# Patient Record
Sex: Female | Born: 1945 | Race: White | Hispanic: No | Marital: Married | Smoking: Never smoker
Health system: Southern US, Community
[De-identification: ages and names within clinical notes are randomized; demographics above are authoritative.]

## PROBLEM LIST (undated history)

## (undated) DIAGNOSIS — Z9889 Other specified postprocedural states: Secondary | ICD-10-CM

## (undated) DIAGNOSIS — F419 Anxiety disorder, unspecified: Secondary | ICD-10-CM

## (undated) DIAGNOSIS — F32A Depression, unspecified: Secondary | ICD-10-CM

## (undated) DIAGNOSIS — K759 Inflammatory liver disease, unspecified: Secondary | ICD-10-CM

## (undated) DIAGNOSIS — K219 Gastro-esophageal reflux disease without esophagitis: Secondary | ICD-10-CM

## (undated) DIAGNOSIS — Z800 Family history of malignant neoplasm of digestive organs: Secondary | ICD-10-CM

## (undated) DIAGNOSIS — Z860100 Personal history of colon polyps, unspecified: Secondary | ICD-10-CM

## (undated) DIAGNOSIS — E7800 Pure hypercholesterolemia, unspecified: Secondary | ICD-10-CM

## (undated) DIAGNOSIS — Z8619 Personal history of other infectious and parasitic diseases: Secondary | ICD-10-CM

## (undated) HISTORY — DX: Personal history of colonic polyps: Z86.010

## (undated) HISTORY — DX: Pure hypercholesterolemia, unspecified: E78.00

## (undated) HISTORY — PX: TUBAL LIGATION: SHX77

## (undated) HISTORY — DX: Gastro-esophageal reflux disease without esophagitis: K21.9

## (undated) HISTORY — PX: KNEE ARTHROSCOPY: SUR90

## (undated) HISTORY — DX: Personal history of other infectious and parasitic diseases: Z86.19

## (undated) HISTORY — DX: Personal history of colon polyps, unspecified: Z86.0100

## (undated) HISTORY — DX: Depression, unspecified: F32.A

## (undated) HISTORY — DX: Family history of malignant neoplasm of digestive organs: Z80.0

## (undated) HISTORY — PX: LARYNGOPLASTY: SHX282

---

## 1998-04-21 ENCOUNTER — Ambulatory Visit (HOSPITAL_COMMUNITY): Admission: RE | Admit: 1998-04-21 | Discharge: 1998-04-22 | Payer: Self-pay | Admitting: General Surgery

## 2005-05-24 ENCOUNTER — Ambulatory Visit (HOSPITAL_COMMUNITY): Admission: RE | Admit: 2005-05-24 | Discharge: 2005-05-24 | Payer: Self-pay | Admitting: Orthopaedic Surgery

## 2005-05-24 ENCOUNTER — Ambulatory Visit (HOSPITAL_BASED_OUTPATIENT_CLINIC_OR_DEPARTMENT_OTHER): Admission: RE | Admit: 2005-05-24 | Discharge: 2005-05-24 | Payer: Self-pay | Admitting: Orthopaedic Surgery

## 2016-12-07 DIAGNOSIS — G4700 Insomnia, unspecified: Secondary | ICD-10-CM | POA: Diagnosis not present

## 2016-12-07 DIAGNOSIS — K219 Gastro-esophageal reflux disease without esophagitis: Secondary | ICD-10-CM | POA: Diagnosis not present

## 2016-12-07 DIAGNOSIS — Z79899 Other long term (current) drug therapy: Secondary | ICD-10-CM | POA: Diagnosis not present

## 2016-12-07 DIAGNOSIS — E785 Hyperlipidemia, unspecified: Secondary | ICD-10-CM | POA: Diagnosis not present

## 2016-12-09 DIAGNOSIS — Z79899 Other long term (current) drug therapy: Secondary | ICD-10-CM | POA: Diagnosis not present

## 2016-12-09 DIAGNOSIS — E785 Hyperlipidemia, unspecified: Secondary | ICD-10-CM | POA: Diagnosis not present

## 2017-05-30 DIAGNOSIS — Z79899 Other long term (current) drug therapy: Secondary | ICD-10-CM | POA: Diagnosis not present

## 2017-05-30 DIAGNOSIS — G4700 Insomnia, unspecified: Secondary | ICD-10-CM | POA: Diagnosis not present

## 2017-05-30 DIAGNOSIS — K219 Gastro-esophageal reflux disease without esophagitis: Secondary | ICD-10-CM | POA: Diagnosis not present

## 2017-05-30 DIAGNOSIS — E785 Hyperlipidemia, unspecified: Secondary | ICD-10-CM | POA: Diagnosis not present

## 2017-07-27 DIAGNOSIS — D485 Neoplasm of uncertain behavior of skin: Secondary | ICD-10-CM | POA: Diagnosis not present

## 2017-07-27 DIAGNOSIS — C44519 Basal cell carcinoma of skin of other part of trunk: Secondary | ICD-10-CM | POA: Diagnosis not present

## 2017-07-27 DIAGNOSIS — D171 Benign lipomatous neoplasm of skin and subcutaneous tissue of trunk: Secondary | ICD-10-CM | POA: Diagnosis not present

## 2017-07-27 DIAGNOSIS — L821 Other seborrheic keratosis: Secondary | ICD-10-CM | POA: Diagnosis not present

## 2017-07-27 DIAGNOSIS — L720 Epidermal cyst: Secondary | ICD-10-CM | POA: Diagnosis not present

## 2017-08-17 DIAGNOSIS — M7742 Metatarsalgia, left foot: Secondary | ICD-10-CM | POA: Diagnosis not present

## 2017-09-12 DIAGNOSIS — C44719 Basal cell carcinoma of skin of left lower limb, including hip: Secondary | ICD-10-CM | POA: Diagnosis not present

## 2017-09-19 DIAGNOSIS — C44519 Basal cell carcinoma of skin of other part of trunk: Secondary | ICD-10-CM | POA: Diagnosis not present

## 2017-11-30 DIAGNOSIS — E785 Hyperlipidemia, unspecified: Secondary | ICD-10-CM | POA: Diagnosis not present

## 2017-11-30 DIAGNOSIS — Z79899 Other long term (current) drug therapy: Secondary | ICD-10-CM | POA: Diagnosis not present

## 2017-11-30 DIAGNOSIS — G4700 Insomnia, unspecified: Secondary | ICD-10-CM | POA: Diagnosis not present

## 2017-11-30 DIAGNOSIS — Z131 Encounter for screening for diabetes mellitus: Secondary | ICD-10-CM | POA: Diagnosis not present

## 2017-12-20 DIAGNOSIS — R0982 Postnasal drip: Secondary | ICD-10-CM | POA: Diagnosis not present

## 2017-12-20 DIAGNOSIS — J029 Acute pharyngitis, unspecified: Secondary | ICD-10-CM | POA: Diagnosis not present

## 2018-01-10 DIAGNOSIS — G4700 Insomnia, unspecified: Secondary | ICD-10-CM | POA: Diagnosis not present

## 2018-04-02 DIAGNOSIS — L255 Unspecified contact dermatitis due to plants, except food: Secondary | ICD-10-CM | POA: Diagnosis not present

## 2018-06-18 DIAGNOSIS — Z6820 Body mass index (BMI) 20.0-20.9, adult: Secondary | ICD-10-CM | POA: Diagnosis not present

## 2018-06-18 DIAGNOSIS — K219 Gastro-esophageal reflux disease without esophagitis: Secondary | ICD-10-CM | POA: Diagnosis not present

## 2018-06-18 DIAGNOSIS — G4700 Insomnia, unspecified: Secondary | ICD-10-CM | POA: Diagnosis not present

## 2018-06-18 DIAGNOSIS — R7303 Prediabetes: Secondary | ICD-10-CM | POA: Diagnosis not present

## 2018-06-18 DIAGNOSIS — Z79899 Other long term (current) drug therapy: Secondary | ICD-10-CM | POA: Diagnosis not present

## 2018-06-18 DIAGNOSIS — E785 Hyperlipidemia, unspecified: Secondary | ICD-10-CM | POA: Diagnosis not present

## 2018-06-26 DIAGNOSIS — H524 Presbyopia: Secondary | ICD-10-CM | POA: Diagnosis not present

## 2018-06-26 DIAGNOSIS — H2513 Age-related nuclear cataract, bilateral: Secondary | ICD-10-CM | POA: Diagnosis not present

## 2018-09-26 DIAGNOSIS — Z0001 Encounter for general adult medical examination with abnormal findings: Secondary | ICD-10-CM | POA: Diagnosis not present

## 2018-09-26 DIAGNOSIS — Z1339 Encounter for screening examination for other mental health and behavioral disorders: Secondary | ICD-10-CM | POA: Diagnosis not present

## 2018-09-26 DIAGNOSIS — K649 Unspecified hemorrhoids: Secondary | ICD-10-CM | POA: Diagnosis not present

## 2018-09-26 DIAGNOSIS — Z6821 Body mass index (BMI) 21.0-21.9, adult: Secondary | ICD-10-CM | POA: Diagnosis not present

## 2018-09-26 DIAGNOSIS — Z1331 Encounter for screening for depression: Secondary | ICD-10-CM | POA: Diagnosis not present

## 2018-09-26 DIAGNOSIS — Z23 Encounter for immunization: Secondary | ICD-10-CM | POA: Diagnosis not present

## 2018-10-02 DIAGNOSIS — C44619 Basal cell carcinoma of skin of left upper limb, including shoulder: Secondary | ICD-10-CM | POA: Diagnosis not present

## 2018-10-02 DIAGNOSIS — C44719 Basal cell carcinoma of skin of left lower limb, including hip: Secondary | ICD-10-CM | POA: Diagnosis not present

## 2018-11-13 DIAGNOSIS — R1013 Epigastric pain: Secondary | ICD-10-CM | POA: Diagnosis not present

## 2018-11-14 ENCOUNTER — Other Ambulatory Visit: Payer: Self-pay | Admitting: Family

## 2018-11-14 ENCOUNTER — Ambulatory Visit
Admission: RE | Admit: 2018-11-14 | Discharge: 2018-11-14 | Disposition: A | Discharge status: Home / Self Care | Payer: Self-pay | Source: Ambulatory Visit | Attending: Family | Admitting: Family

## 2018-11-14 DIAGNOSIS — Z6822 Body mass index (BMI) 22.0-22.9, adult: Secondary | ICD-10-CM | POA: Diagnosis not present

## 2018-11-14 DIAGNOSIS — R29818 Other symptoms and signs involving the nervous system: Secondary | ICD-10-CM

## 2018-11-14 DIAGNOSIS — R079 Chest pain, unspecified: Secondary | ICD-10-CM | POA: Diagnosis not present

## 2018-11-14 DIAGNOSIS — R55 Syncope and collapse: Secondary | ICD-10-CM | POA: Diagnosis not present

## 2018-11-16 DIAGNOSIS — R1013 Epigastric pain: Secondary | ICD-10-CM | POA: Diagnosis not present

## 2018-11-16 DIAGNOSIS — M4186 Other forms of scoliosis, lumbar region: Secondary | ICD-10-CM | POA: Diagnosis not present

## 2018-11-16 DIAGNOSIS — I700 Atherosclerosis of aorta: Secondary | ICD-10-CM | POA: Diagnosis not present

## 2018-11-16 DIAGNOSIS — K5730 Diverticulosis of large intestine without perforation or abscess without bleeding: Secondary | ICD-10-CM | POA: Diagnosis not present

## 2018-11-23 DIAGNOSIS — R1013 Epigastric pain: Secondary | ICD-10-CM | POA: Diagnosis not present

## 2018-11-27 DIAGNOSIS — R079 Chest pain, unspecified: Secondary | ICD-10-CM | POA: Diagnosis not present

## 2018-11-27 DIAGNOSIS — R29818 Other symptoms and signs involving the nervous system: Secondary | ICD-10-CM | POA: Diagnosis not present

## 2018-11-27 DIAGNOSIS — I6523 Occlusion and stenosis of bilateral carotid arteries: Secondary | ICD-10-CM | POA: Diagnosis not present

## 2018-12-25 DIAGNOSIS — C44629 Squamous cell carcinoma of skin of left upper limb, including shoulder: Secondary | ICD-10-CM | POA: Diagnosis not present

## 2019-03-25 DIAGNOSIS — M25561 Pain in right knee: Secondary | ICD-10-CM | POA: Diagnosis not present

## 2019-04-12 DIAGNOSIS — M25561 Pain in right knee: Secondary | ICD-10-CM | POA: Diagnosis not present

## 2019-04-16 DIAGNOSIS — G4700 Insomnia, unspecified: Secondary | ICD-10-CM | POA: Diagnosis not present

## 2019-04-16 DIAGNOSIS — Z6822 Body mass index (BMI) 22.0-22.9, adult: Secondary | ICD-10-CM | POA: Diagnosis not present

## 2019-04-16 DIAGNOSIS — Z79899 Other long term (current) drug therapy: Secondary | ICD-10-CM | POA: Diagnosis not present

## 2019-04-16 DIAGNOSIS — E785 Hyperlipidemia, unspecified: Secondary | ICD-10-CM | POA: Diagnosis not present

## 2019-04-24 DIAGNOSIS — M1711 Unilateral primary osteoarthritis, right knee: Secondary | ICD-10-CM | POA: Diagnosis not present

## 2019-04-25 DIAGNOSIS — L910 Hypertrophic scar: Secondary | ICD-10-CM | POA: Diagnosis not present

## 2019-05-16 DIAGNOSIS — Y999 Unspecified external cause status: Secondary | ICD-10-CM | POA: Diagnosis not present

## 2019-05-16 DIAGNOSIS — M1711 Unilateral primary osteoarthritis, right knee: Secondary | ICD-10-CM | POA: Diagnosis not present

## 2019-05-16 DIAGNOSIS — X58XXXA Exposure to other specified factors, initial encounter: Secondary | ICD-10-CM | POA: Diagnosis not present

## 2019-05-16 DIAGNOSIS — S83231A Complex tear of medial meniscus, current injury, right knee, initial encounter: Secondary | ICD-10-CM | POA: Diagnosis not present

## 2019-05-16 DIAGNOSIS — S83271A Complex tear of lateral meniscus, current injury, right knee, initial encounter: Secondary | ICD-10-CM | POA: Diagnosis not present

## 2019-05-27 DIAGNOSIS — M1711 Unilateral primary osteoarthritis, right knee: Secondary | ICD-10-CM | POA: Diagnosis not present

## 2019-06-04 DIAGNOSIS — M25561 Pain in right knee: Secondary | ICD-10-CM | POA: Diagnosis not present

## 2019-06-12 DIAGNOSIS — M25561 Pain in right knee: Secondary | ICD-10-CM | POA: Diagnosis not present

## 2019-06-12 DIAGNOSIS — I10 Essential (primary) hypertension: Secondary | ICD-10-CM | POA: Diagnosis not present

## 2019-06-17 DIAGNOSIS — M25511 Pain in right shoulder: Secondary | ICD-10-CM | POA: Diagnosis not present

## 2019-06-24 DIAGNOSIS — M25511 Pain in right shoulder: Secondary | ICD-10-CM | POA: Diagnosis not present

## 2019-06-26 DIAGNOSIS — M25511 Pain in right shoulder: Secondary | ICD-10-CM | POA: Diagnosis not present

## 2019-06-27 DIAGNOSIS — M25511 Pain in right shoulder: Secondary | ICD-10-CM | POA: Diagnosis not present

## 2019-06-28 ENCOUNTER — Other Ambulatory Visit: Payer: Self-pay

## 2019-07-01 DIAGNOSIS — M25511 Pain in right shoulder: Secondary | ICD-10-CM | POA: Diagnosis not present

## 2019-07-03 DIAGNOSIS — M25511 Pain in right shoulder: Secondary | ICD-10-CM | POA: Diagnosis not present

## 2019-07-08 DIAGNOSIS — M25511 Pain in right shoulder: Secondary | ICD-10-CM | POA: Diagnosis not present

## 2019-07-10 DIAGNOSIS — M25511 Pain in right shoulder: Secondary | ICD-10-CM | POA: Diagnosis not present

## 2019-07-16 DIAGNOSIS — M25511 Pain in right shoulder: Secondary | ICD-10-CM | POA: Diagnosis not present

## 2019-07-23 DIAGNOSIS — Z1331 Encounter for screening for depression: Secondary | ICD-10-CM | POA: Diagnosis not present

## 2019-07-23 DIAGNOSIS — Z6821 Body mass index (BMI) 21.0-21.9, adult: Secondary | ICD-10-CM | POA: Diagnosis not present

## 2019-07-23 DIAGNOSIS — M25511 Pain in right shoulder: Secondary | ICD-10-CM | POA: Diagnosis not present

## 2019-07-23 DIAGNOSIS — G4700 Insomnia, unspecified: Secondary | ICD-10-CM | POA: Diagnosis not present

## 2019-07-29 DIAGNOSIS — M25511 Pain in right shoulder: Secondary | ICD-10-CM | POA: Diagnosis not present

## 2019-09-25 DIAGNOSIS — M25511 Pain in right shoulder: Secondary | ICD-10-CM | POA: Diagnosis not present

## 2019-09-30 DIAGNOSIS — M25511 Pain in right shoulder: Secondary | ICD-10-CM | POA: Diagnosis not present

## 2019-10-03 DIAGNOSIS — M25511 Pain in right shoulder: Secondary | ICD-10-CM | POA: Diagnosis not present

## 2019-10-07 DIAGNOSIS — M25511 Pain in right shoulder: Secondary | ICD-10-CM | POA: Diagnosis not present

## 2019-10-10 DIAGNOSIS — M25511 Pain in right shoulder: Secondary | ICD-10-CM | POA: Diagnosis not present

## 2019-10-15 DIAGNOSIS — Z6821 Body mass index (BMI) 21.0-21.9, adult: Secondary | ICD-10-CM | POA: Diagnosis not present

## 2019-10-15 DIAGNOSIS — F5101 Primary insomnia: Secondary | ICD-10-CM | POA: Diagnosis not present

## 2019-10-15 DIAGNOSIS — Z1211 Encounter for screening for malignant neoplasm of colon: Secondary | ICD-10-CM | POA: Diagnosis not present

## 2019-10-15 DIAGNOSIS — E785 Hyperlipidemia, unspecified: Secondary | ICD-10-CM | POA: Diagnosis not present

## 2019-10-15 DIAGNOSIS — Z79899 Other long term (current) drug therapy: Secondary | ICD-10-CM | POA: Diagnosis not present

## 2019-10-18 DIAGNOSIS — M25511 Pain in right shoulder: Secondary | ICD-10-CM | POA: Diagnosis not present

## 2019-10-23 DIAGNOSIS — M25511 Pain in right shoulder: Secondary | ICD-10-CM | POA: Diagnosis not present

## 2019-10-23 DIAGNOSIS — M6281 Muscle weakness (generalized): Secondary | ICD-10-CM | POA: Diagnosis not present

## 2019-10-23 DIAGNOSIS — M25611 Stiffness of right shoulder, not elsewhere classified: Secondary | ICD-10-CM | POA: Diagnosis not present

## 2019-10-28 DIAGNOSIS — M25611 Stiffness of right shoulder, not elsewhere classified: Secondary | ICD-10-CM | POA: Diagnosis not present

## 2019-10-28 DIAGNOSIS — M6281 Muscle weakness (generalized): Secondary | ICD-10-CM | POA: Diagnosis not present

## 2019-10-29 DIAGNOSIS — Z1211 Encounter for screening for malignant neoplasm of colon: Secondary | ICD-10-CM | POA: Diagnosis not present

## 2019-10-29 DIAGNOSIS — Z1212 Encounter for screening for malignant neoplasm of rectum: Secondary | ICD-10-CM | POA: Diagnosis not present

## 2019-11-04 DIAGNOSIS — M6281 Muscle weakness (generalized): Secondary | ICD-10-CM | POA: Diagnosis not present

## 2019-11-04 DIAGNOSIS — M25611 Stiffness of right shoulder, not elsewhere classified: Secondary | ICD-10-CM | POA: Diagnosis not present

## 2019-11-05 DIAGNOSIS — C44519 Basal cell carcinoma of skin of other part of trunk: Secondary | ICD-10-CM | POA: Diagnosis not present

## 2019-11-11 DIAGNOSIS — M6281 Muscle weakness (generalized): Secondary | ICD-10-CM | POA: Diagnosis not present

## 2019-11-11 DIAGNOSIS — M25611 Stiffness of right shoulder, not elsewhere classified: Secondary | ICD-10-CM | POA: Diagnosis not present

## 2019-11-19 DIAGNOSIS — M25611 Stiffness of right shoulder, not elsewhere classified: Secondary | ICD-10-CM | POA: Diagnosis not present

## 2019-11-19 DIAGNOSIS — M6281 Muscle weakness (generalized): Secondary | ICD-10-CM | POA: Diagnosis not present

## 2019-11-27 DIAGNOSIS — C44529 Squamous cell carcinoma of skin of other part of trunk: Secondary | ICD-10-CM | POA: Diagnosis not present

## 2019-12-04 DIAGNOSIS — M25611 Stiffness of right shoulder, not elsewhere classified: Secondary | ICD-10-CM | POA: Diagnosis not present

## 2019-12-04 DIAGNOSIS — R2231 Localized swelling, mass and lump, right upper limb: Secondary | ICD-10-CM | POA: Diagnosis not present

## 2019-12-04 DIAGNOSIS — M6281 Muscle weakness (generalized): Secondary | ICD-10-CM | POA: Diagnosis not present

## 2019-12-04 DIAGNOSIS — M7521 Bicipital tendinitis, right shoulder: Secondary | ICD-10-CM | POA: Diagnosis not present

## 2019-12-12 DIAGNOSIS — M25511 Pain in right shoulder: Secondary | ICD-10-CM | POA: Diagnosis not present

## 2019-12-18 DIAGNOSIS — E785 Hyperlipidemia, unspecified: Secondary | ICD-10-CM | POA: Diagnosis not present

## 2019-12-18 DIAGNOSIS — K219 Gastro-esophageal reflux disease without esophagitis: Secondary | ICD-10-CM | POA: Diagnosis not present

## 2019-12-18 DIAGNOSIS — Z6821 Body mass index (BMI) 21.0-21.9, adult: Secondary | ICD-10-CM | POA: Diagnosis not present

## 2019-12-18 DIAGNOSIS — F5101 Primary insomnia: Secondary | ICD-10-CM | POA: Diagnosis not present

## 2019-12-18 DIAGNOSIS — L231 Allergic contact dermatitis due to adhesives: Secondary | ICD-10-CM | POA: Diagnosis not present

## 2019-12-25 DIAGNOSIS — D2111 Benign neoplasm of connective and other soft tissue of right upper limb, including shoulder: Secondary | ICD-10-CM | POA: Diagnosis not present

## 2020-01-30 DIAGNOSIS — D1721 Benign lipomatous neoplasm of skin and subcutaneous tissue of right arm: Secondary | ICD-10-CM | POA: Diagnosis not present

## 2020-02-10 DIAGNOSIS — Z9889 Other specified postprocedural states: Secondary | ICD-10-CM | POA: Diagnosis not present

## 2020-02-13 DIAGNOSIS — K219 Gastro-esophageal reflux disease without esophagitis: Secondary | ICD-10-CM | POA: Diagnosis not present

## 2020-02-13 DIAGNOSIS — K5900 Constipation, unspecified: Secondary | ICD-10-CM | POA: Diagnosis not present

## 2020-02-13 DIAGNOSIS — R1013 Epigastric pain: Secondary | ICD-10-CM | POA: Diagnosis not present

## 2020-03-04 DIAGNOSIS — M1711 Unilateral primary osteoarthritis, right knee: Secondary | ICD-10-CM | POA: Diagnosis not present

## 2020-03-18 DIAGNOSIS — R1013 Epigastric pain: Secondary | ICD-10-CM | POA: Diagnosis not present

## 2020-03-18 DIAGNOSIS — K5900 Constipation, unspecified: Secondary | ICD-10-CM | POA: Diagnosis not present

## 2020-03-18 DIAGNOSIS — K219 Gastro-esophageal reflux disease without esophagitis: Secondary | ICD-10-CM | POA: Diagnosis not present

## 2020-03-23 DIAGNOSIS — K219 Gastro-esophageal reflux disease without esophagitis: Secondary | ICD-10-CM | POA: Diagnosis not present

## 2020-03-23 DIAGNOSIS — E785 Hyperlipidemia, unspecified: Secondary | ICD-10-CM | POA: Diagnosis not present

## 2020-03-23 DIAGNOSIS — R1013 Epigastric pain: Secondary | ICD-10-CM | POA: Diagnosis not present

## 2020-03-23 DIAGNOSIS — Z1331 Encounter for screening for depression: Secondary | ICD-10-CM | POA: Diagnosis not present

## 2020-03-23 DIAGNOSIS — Z79899 Other long term (current) drug therapy: Secondary | ICD-10-CM | POA: Diagnosis not present

## 2020-03-23 DIAGNOSIS — F5101 Primary insomnia: Secondary | ICD-10-CM | POA: Diagnosis not present

## 2020-04-01 DIAGNOSIS — Z1159 Encounter for screening for other viral diseases: Secondary | ICD-10-CM | POA: Diagnosis not present

## 2020-04-01 DIAGNOSIS — Z20828 Contact with and (suspected) exposure to other viral communicable diseases: Secondary | ICD-10-CM | POA: Diagnosis not present

## 2020-04-07 DIAGNOSIS — K222 Esophageal obstruction: Secondary | ICD-10-CM | POA: Diagnosis not present

## 2020-04-07 DIAGNOSIS — R110 Nausea: Secondary | ICD-10-CM | POA: Diagnosis not present

## 2020-04-07 DIAGNOSIS — K219 Gastro-esophageal reflux disease without esophagitis: Secondary | ICD-10-CM | POA: Diagnosis not present

## 2020-04-07 DIAGNOSIS — Z8619 Personal history of other infectious and parasitic diseases: Secondary | ICD-10-CM | POA: Diagnosis not present

## 2020-04-07 DIAGNOSIS — R140 Abdominal distension (gaseous): Secondary | ICD-10-CM | POA: Diagnosis not present

## 2020-04-07 DIAGNOSIS — Z79899 Other long term (current) drug therapy: Secondary | ICD-10-CM | POA: Diagnosis not present

## 2020-04-07 DIAGNOSIS — K449 Diaphragmatic hernia without obstruction or gangrene: Secondary | ICD-10-CM | POA: Diagnosis not present

## 2020-04-07 DIAGNOSIS — R1013 Epigastric pain: Secondary | ICD-10-CM | POA: Diagnosis not present

## 2020-06-05 DIAGNOSIS — S61211A Laceration without foreign body of left index finger without damage to nail, initial encounter: Secondary | ICD-10-CM | POA: Diagnosis not present

## 2020-06-05 DIAGNOSIS — Z23 Encounter for immunization: Secondary | ICD-10-CM | POA: Diagnosis not present

## 2020-06-15 DIAGNOSIS — Z4802 Encounter for removal of sutures: Secondary | ICD-10-CM | POA: Diagnosis not present

## 2020-06-15 DIAGNOSIS — S61211D Laceration without foreign body of left index finger without damage to nail, subsequent encounter: Secondary | ICD-10-CM | POA: Diagnosis not present

## 2020-07-06 DIAGNOSIS — M79645 Pain in left finger(s): Secondary | ICD-10-CM | POA: Diagnosis not present

## 2020-07-06 DIAGNOSIS — S66321D Laceration of extensor muscle, fascia and tendon of left index finger at wrist and hand level, subsequent encounter: Secondary | ICD-10-CM | POA: Diagnosis not present

## 2020-07-06 DIAGNOSIS — M6281 Muscle weakness (generalized): Secondary | ICD-10-CM | POA: Diagnosis not present

## 2020-07-06 DIAGNOSIS — M25642 Stiffness of left hand, not elsewhere classified: Secondary | ICD-10-CM | POA: Diagnosis not present

## 2020-07-08 DIAGNOSIS — S66321D Laceration of extensor muscle, fascia and tendon of left index finger at wrist and hand level, subsequent encounter: Secondary | ICD-10-CM | POA: Diagnosis not present

## 2020-07-08 DIAGNOSIS — M25642 Stiffness of left hand, not elsewhere classified: Secondary | ICD-10-CM | POA: Diagnosis not present

## 2020-07-08 DIAGNOSIS — M6281 Muscle weakness (generalized): Secondary | ICD-10-CM | POA: Diagnosis not present

## 2020-07-08 DIAGNOSIS — M79645 Pain in left finger(s): Secondary | ICD-10-CM | POA: Diagnosis not present

## 2020-07-10 IMAGING — CT CT HEAD W/O CM
1 series · 16 of 30 positions shown, 20 images · non-contrast
Comparison: None

CLINICAL DATA: Syncopal episode [REDACTED] night, unable to feel LEFT
hand, severe headache

EXAM:
CT HEAD WITHOUT CONTRAST
TECHNIQUE: Contiguous axial images were obtained from the base of the skull
through the vertex without intravenous contrast. Sagittal and
coronal MPR images reconstructed from axial data set.

[Series 2: head w/(date) · axial · 0.43mm/px · z∈[-193,-28]mm · 16 of 37 slices shown, 20 images]
[im 2/37  brain]
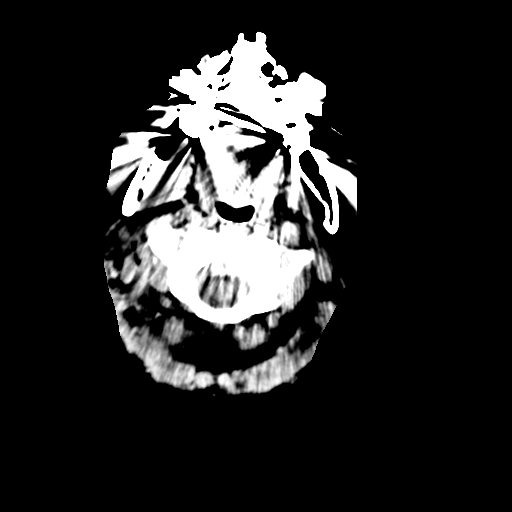
[im 2/37  bone]
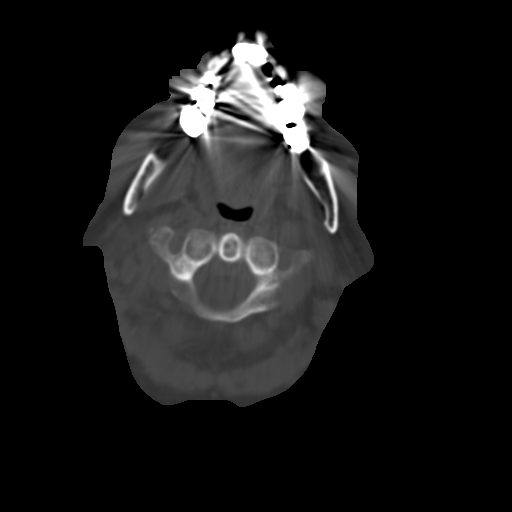
[im 4/37  brain]
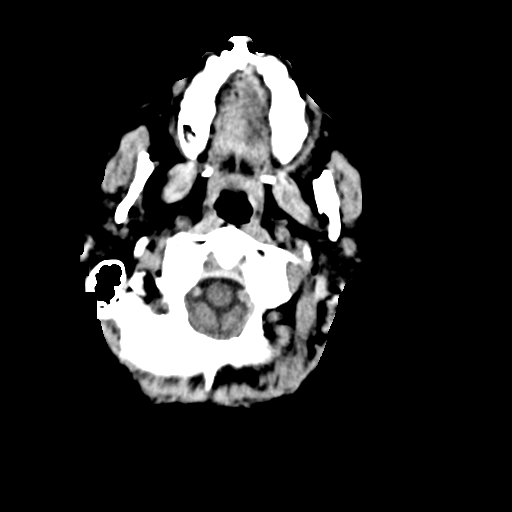
[im 7/37  brain]
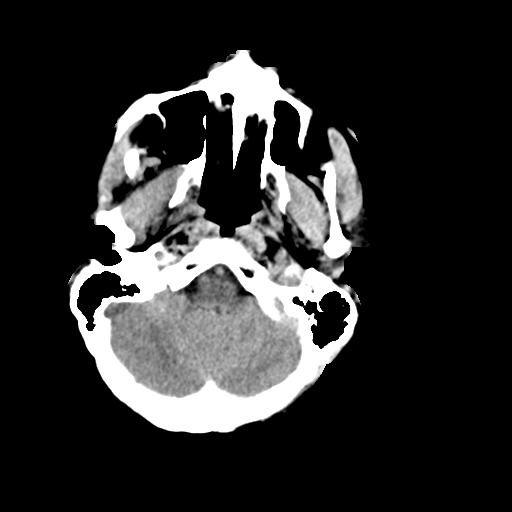
[im 9/37  brain]
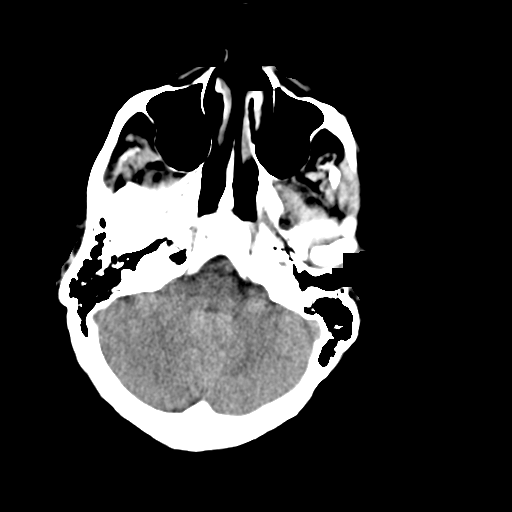
[im 10/37  brain]
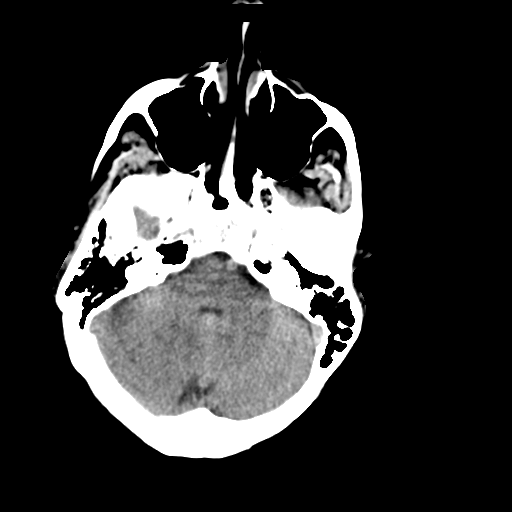
[im 10/37  bone]
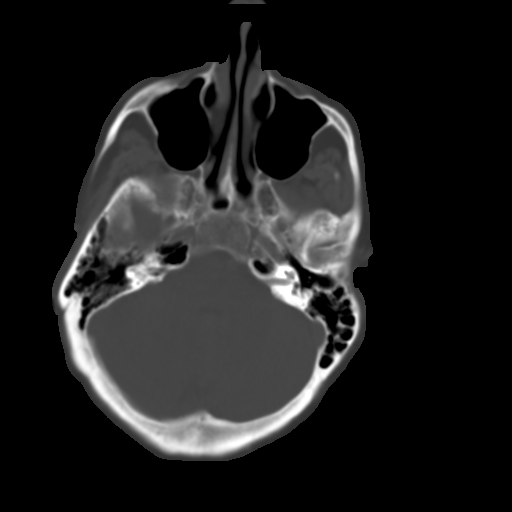
[im 13/37  brain]
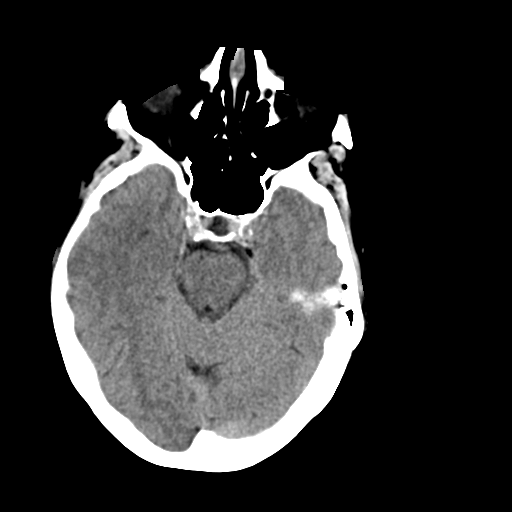
[im 15/37  brain]
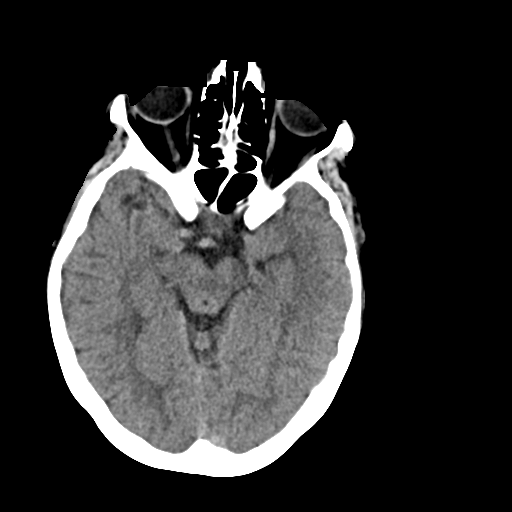
[im 18/37  brain]
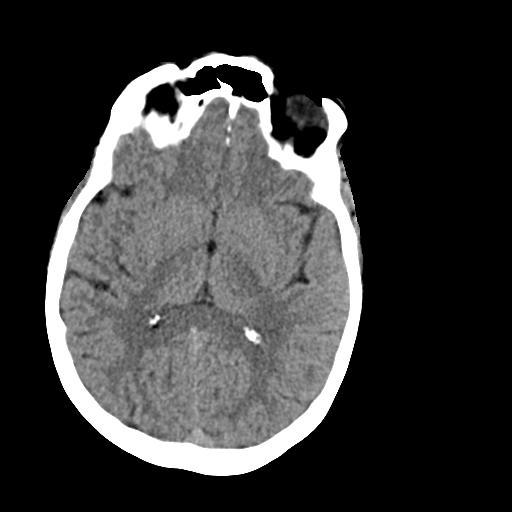
[im 19/37  brain]
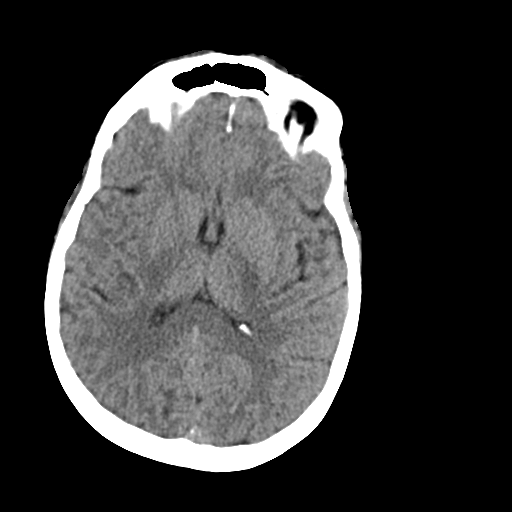
[im 19/37  bone]
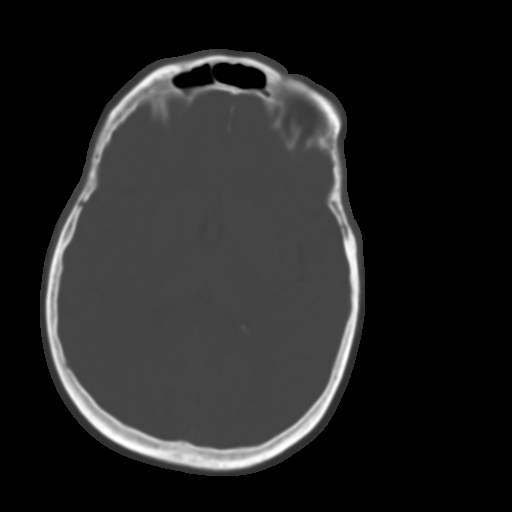
[im 22/37  brain]
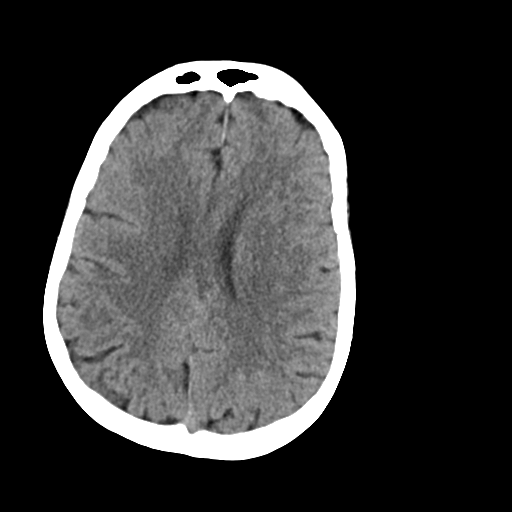
[im 24/37  brain]
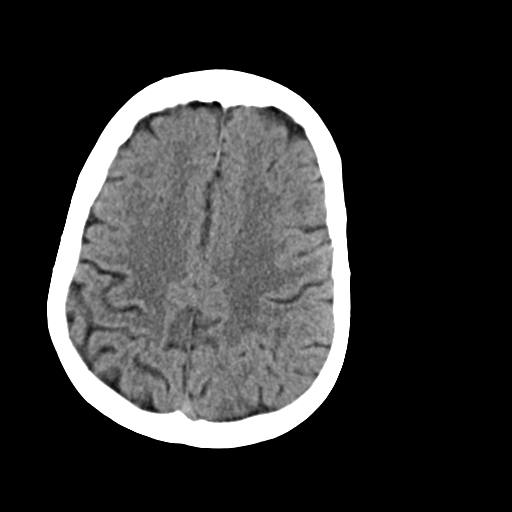
[im 27/37  brain]
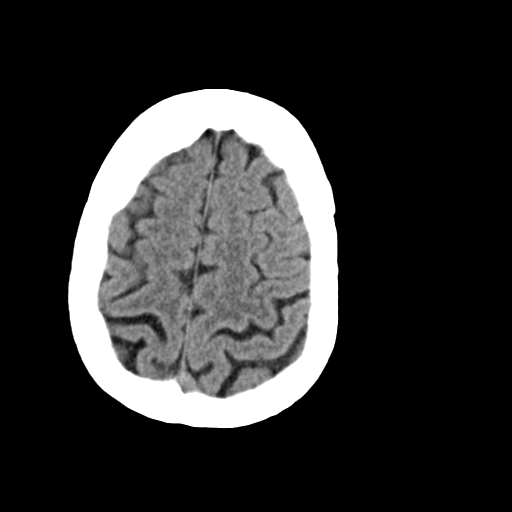
[im 28/37  brain]
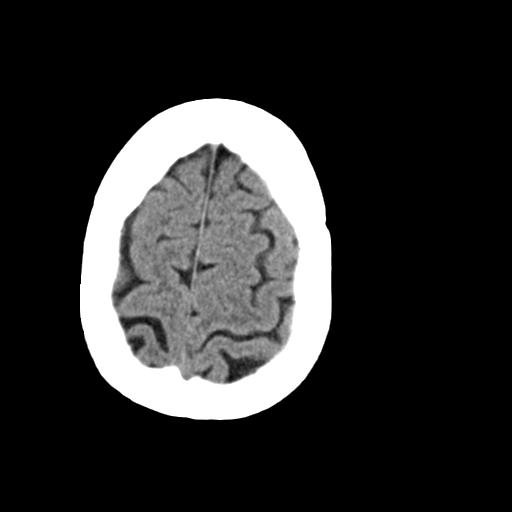
[im 28/37  bone]
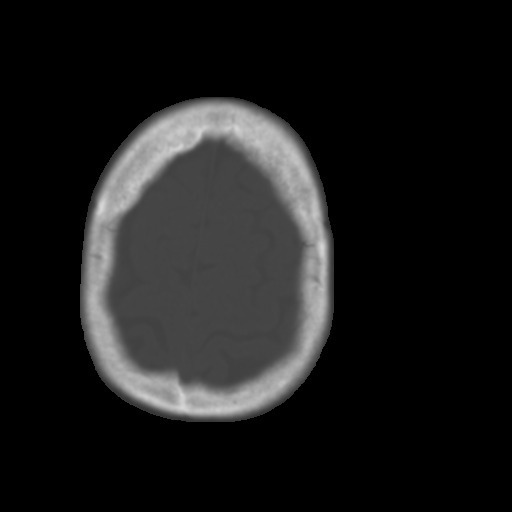
[im 30/37  brain]
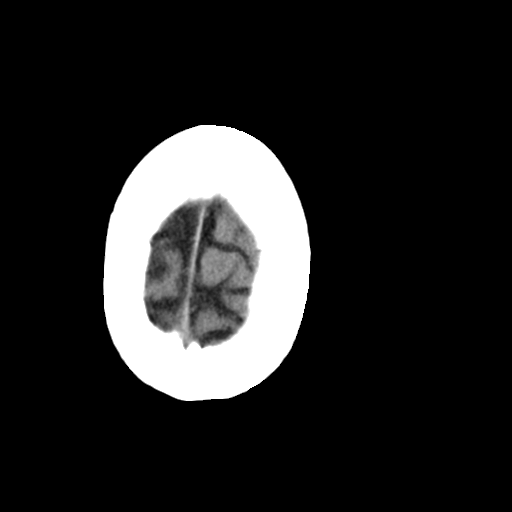
[im 33/37  brain]
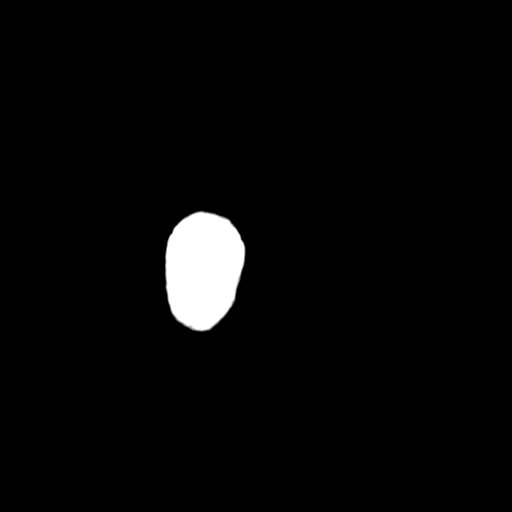
[im 35/37  brain]
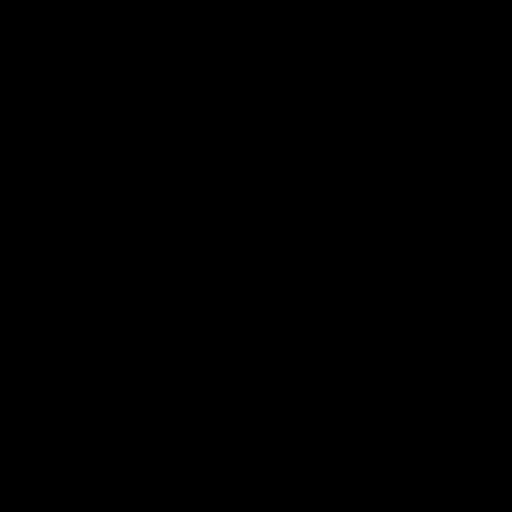

[16 of 30 positions shown; findings below may reference images not displayed]

FINDINGS: Brain: Normal ventricular morphology. No midline shift or mass
effect. Normal appearance of brain parenchyma. No intracranial
hemorrhage, mass lesion, evidence of acute infarction, or
extra-axial fluid collection.

Vascular: Unremarkable

Skull: Intact

Sinuses/Orbits: Clear

Other: Scattered beam hardening artifacts of dental origin.
IMPRESSION: Normal exam.

## 2020-07-13 DIAGNOSIS — M25642 Stiffness of left hand, not elsewhere classified: Secondary | ICD-10-CM | POA: Diagnosis not present

## 2020-07-13 DIAGNOSIS — M6281 Muscle weakness (generalized): Secondary | ICD-10-CM | POA: Diagnosis not present

## 2020-07-13 DIAGNOSIS — M79645 Pain in left finger(s): Secondary | ICD-10-CM | POA: Diagnosis not present

## 2020-07-13 DIAGNOSIS — S66321D Laceration of extensor muscle, fascia and tendon of left index finger at wrist and hand level, subsequent encounter: Secondary | ICD-10-CM | POA: Diagnosis not present

## 2020-07-16 DIAGNOSIS — M6281 Muscle weakness (generalized): Secondary | ICD-10-CM | POA: Diagnosis not present

## 2020-07-16 DIAGNOSIS — S66321D Laceration of extensor muscle, fascia and tendon of left index finger at wrist and hand level, subsequent encounter: Secondary | ICD-10-CM | POA: Diagnosis not present

## 2020-07-16 DIAGNOSIS — M25642 Stiffness of left hand, not elsewhere classified: Secondary | ICD-10-CM | POA: Diagnosis not present

## 2020-07-16 DIAGNOSIS — M79645 Pain in left finger(s): Secondary | ICD-10-CM | POA: Diagnosis not present

## 2020-07-20 DIAGNOSIS — S66321D Laceration of extensor muscle, fascia and tendon of left index finger at wrist and hand level, subsequent encounter: Secondary | ICD-10-CM | POA: Diagnosis not present

## 2020-07-20 DIAGNOSIS — M79645 Pain in left finger(s): Secondary | ICD-10-CM | POA: Diagnosis not present

## 2020-07-20 DIAGNOSIS — M25642 Stiffness of left hand, not elsewhere classified: Secondary | ICD-10-CM | POA: Diagnosis not present

## 2020-07-20 DIAGNOSIS — M6281 Muscle weakness (generalized): Secondary | ICD-10-CM | POA: Diagnosis not present

## 2020-07-22 DIAGNOSIS — S66321D Laceration of extensor muscle, fascia and tendon of left index finger at wrist and hand level, subsequent encounter: Secondary | ICD-10-CM | POA: Diagnosis not present

## 2020-07-22 DIAGNOSIS — M79645 Pain in left finger(s): Secondary | ICD-10-CM | POA: Diagnosis not present

## 2020-07-22 DIAGNOSIS — M6281 Muscle weakness (generalized): Secondary | ICD-10-CM | POA: Diagnosis not present

## 2020-07-22 DIAGNOSIS — M25642 Stiffness of left hand, not elsewhere classified: Secondary | ICD-10-CM | POA: Diagnosis not present

## 2020-07-24 DIAGNOSIS — H5213 Myopia, bilateral: Secondary | ICD-10-CM | POA: Diagnosis not present

## 2020-07-24 DIAGNOSIS — H524 Presbyopia: Secondary | ICD-10-CM | POA: Diagnosis not present

## 2020-07-24 DIAGNOSIS — H25813 Combined forms of age-related cataract, bilateral: Secondary | ICD-10-CM | POA: Diagnosis not present

## 2020-07-24 DIAGNOSIS — H52203 Unspecified astigmatism, bilateral: Secondary | ICD-10-CM | POA: Diagnosis not present

## 2020-07-27 DIAGNOSIS — M79645 Pain in left finger(s): Secondary | ICD-10-CM | POA: Diagnosis not present

## 2020-07-27 DIAGNOSIS — S66321D Laceration of extensor muscle, fascia and tendon of left index finger at wrist and hand level, subsequent encounter: Secondary | ICD-10-CM | POA: Diagnosis not present

## 2020-07-27 DIAGNOSIS — M25642 Stiffness of left hand, not elsewhere classified: Secondary | ICD-10-CM | POA: Diagnosis not present

## 2020-07-27 DIAGNOSIS — M6281 Muscle weakness (generalized): Secondary | ICD-10-CM | POA: Diagnosis not present

## 2020-07-30 DIAGNOSIS — M25642 Stiffness of left hand, not elsewhere classified: Secondary | ICD-10-CM | POA: Diagnosis not present

## 2020-07-30 DIAGNOSIS — S66321D Laceration of extensor muscle, fascia and tendon of left index finger at wrist and hand level, subsequent encounter: Secondary | ICD-10-CM | POA: Diagnosis not present

## 2020-07-30 DIAGNOSIS — M6281 Muscle weakness (generalized): Secondary | ICD-10-CM | POA: Diagnosis not present

## 2020-07-30 DIAGNOSIS — M79645 Pain in left finger(s): Secondary | ICD-10-CM | POA: Diagnosis not present

## 2020-08-04 DIAGNOSIS — M79645 Pain in left finger(s): Secondary | ICD-10-CM | POA: Diagnosis not present

## 2020-08-04 DIAGNOSIS — L820 Inflamed seborrheic keratosis: Secondary | ICD-10-CM | POA: Diagnosis not present

## 2020-08-04 DIAGNOSIS — S66321D Laceration of extensor muscle, fascia and tendon of left index finger at wrist and hand level, subsequent encounter: Secondary | ICD-10-CM | POA: Diagnosis not present

## 2020-08-04 DIAGNOSIS — M6281 Muscle weakness (generalized): Secondary | ICD-10-CM | POA: Diagnosis not present

## 2020-08-04 DIAGNOSIS — M25642 Stiffness of left hand, not elsewhere classified: Secondary | ICD-10-CM | POA: Diagnosis not present

## 2020-08-06 DIAGNOSIS — M25642 Stiffness of left hand, not elsewhere classified: Secondary | ICD-10-CM | POA: Diagnosis not present

## 2020-08-06 DIAGNOSIS — M6281 Muscle weakness (generalized): Secondary | ICD-10-CM | POA: Diagnosis not present

## 2020-08-06 DIAGNOSIS — S66321D Laceration of extensor muscle, fascia and tendon of left index finger at wrist and hand level, subsequent encounter: Secondary | ICD-10-CM | POA: Diagnosis not present

## 2020-08-06 DIAGNOSIS — M79645 Pain in left finger(s): Secondary | ICD-10-CM | POA: Diagnosis not present

## 2020-08-10 DIAGNOSIS — M25642 Stiffness of left hand, not elsewhere classified: Secondary | ICD-10-CM | POA: Diagnosis not present

## 2020-08-10 DIAGNOSIS — S66321D Laceration of extensor muscle, fascia and tendon of left index finger at wrist and hand level, subsequent encounter: Secondary | ICD-10-CM | POA: Diagnosis not present

## 2020-08-10 DIAGNOSIS — M6281 Muscle weakness (generalized): Secondary | ICD-10-CM | POA: Diagnosis not present

## 2020-08-10 DIAGNOSIS — M79645 Pain in left finger(s): Secondary | ICD-10-CM | POA: Diagnosis not present

## 2020-08-13 DIAGNOSIS — S66321D Laceration of extensor muscle, fascia and tendon of left index finger at wrist and hand level, subsequent encounter: Secondary | ICD-10-CM | POA: Diagnosis not present

## 2020-08-13 DIAGNOSIS — M25642 Stiffness of left hand, not elsewhere classified: Secondary | ICD-10-CM | POA: Diagnosis not present

## 2020-08-13 DIAGNOSIS — M6281 Muscle weakness (generalized): Secondary | ICD-10-CM | POA: Diagnosis not present

## 2020-08-13 DIAGNOSIS — M79645 Pain in left finger(s): Secondary | ICD-10-CM | POA: Diagnosis not present

## 2020-08-17 DIAGNOSIS — M25642 Stiffness of left hand, not elsewhere classified: Secondary | ICD-10-CM | POA: Diagnosis not present

## 2020-08-17 DIAGNOSIS — M6281 Muscle weakness (generalized): Secondary | ICD-10-CM | POA: Diagnosis not present

## 2020-08-17 DIAGNOSIS — S66321D Laceration of extensor muscle, fascia and tendon of left index finger at wrist and hand level, subsequent encounter: Secondary | ICD-10-CM | POA: Diagnosis not present

## 2020-08-17 DIAGNOSIS — M79645 Pain in left finger(s): Secondary | ICD-10-CM | POA: Diagnosis not present

## 2020-08-20 DIAGNOSIS — S66321D Laceration of extensor muscle, fascia and tendon of left index finger at wrist and hand level, subsequent encounter: Secondary | ICD-10-CM | POA: Diagnosis not present

## 2020-08-20 DIAGNOSIS — M25642 Stiffness of left hand, not elsewhere classified: Secondary | ICD-10-CM | POA: Diagnosis not present

## 2020-08-20 DIAGNOSIS — M79645 Pain in left finger(s): Secondary | ICD-10-CM | POA: Diagnosis not present

## 2020-08-20 DIAGNOSIS — M6281 Muscle weakness (generalized): Secondary | ICD-10-CM | POA: Diagnosis not present

## 2020-08-26 DIAGNOSIS — H25011 Cortical age-related cataract, right eye: Secondary | ICD-10-CM | POA: Diagnosis not present

## 2020-08-26 DIAGNOSIS — H25813 Combined forms of age-related cataract, bilateral: Secondary | ICD-10-CM | POA: Diagnosis not present

## 2020-08-26 DIAGNOSIS — H2511 Age-related nuclear cataract, right eye: Secondary | ICD-10-CM | POA: Diagnosis not present

## 2020-09-02 DIAGNOSIS — H2512 Age-related nuclear cataract, left eye: Secondary | ICD-10-CM | POA: Diagnosis not present

## 2020-09-21 DIAGNOSIS — Z0001 Encounter for general adult medical examination with abnormal findings: Secondary | ICD-10-CM | POA: Diagnosis not present

## 2020-09-21 DIAGNOSIS — E2839 Other primary ovarian failure: Secondary | ICD-10-CM | POA: Diagnosis not present

## 2020-09-21 DIAGNOSIS — Z1339 Encounter for screening examination for other mental health and behavioral disorders: Secondary | ICD-10-CM | POA: Diagnosis not present

## 2020-09-21 DIAGNOSIS — Z681 Body mass index (BMI) 19 or less, adult: Secondary | ICD-10-CM | POA: Diagnosis not present

## 2020-09-21 DIAGNOSIS — Z1159 Encounter for screening for other viral diseases: Secondary | ICD-10-CM | POA: Diagnosis not present

## 2020-09-21 DIAGNOSIS — S51811A Laceration without foreign body of right forearm, initial encounter: Secondary | ICD-10-CM | POA: Diagnosis not present

## 2020-10-02 DIAGNOSIS — Z961 Presence of intraocular lens: Secondary | ICD-10-CM | POA: Diagnosis not present

## 2020-12-22 DIAGNOSIS — F5101 Primary insomnia: Secondary | ICD-10-CM | POA: Diagnosis not present

## 2020-12-22 DIAGNOSIS — E785 Hyperlipidemia, unspecified: Secondary | ICD-10-CM | POA: Diagnosis not present

## 2020-12-22 DIAGNOSIS — R1013 Epigastric pain: Secondary | ICD-10-CM | POA: Diagnosis not present

## 2020-12-22 DIAGNOSIS — K219 Gastro-esophageal reflux disease without esophagitis: Secondary | ICD-10-CM | POA: Diagnosis not present

## 2020-12-22 DIAGNOSIS — Z6820 Body mass index (BMI) 20.0-20.9, adult: Secondary | ICD-10-CM | POA: Diagnosis not present

## 2020-12-28 DIAGNOSIS — M79645 Pain in left finger(s): Secondary | ICD-10-CM | POA: Diagnosis not present

## 2020-12-28 DIAGNOSIS — R29898 Other symptoms and signs involving the musculoskeletal system: Secondary | ICD-10-CM | POA: Diagnosis not present

## 2020-12-28 DIAGNOSIS — M19042 Primary osteoarthritis, left hand: Secondary | ICD-10-CM | POA: Diagnosis not present

## 2021-01-04 DIAGNOSIS — H353132 Nonexudative age-related macular degeneration, bilateral, intermediate dry stage: Secondary | ICD-10-CM | POA: Diagnosis not present

## 2021-01-04 DIAGNOSIS — H26493 Other secondary cataract, bilateral: Secondary | ICD-10-CM | POA: Diagnosis not present

## 2021-01-18 DIAGNOSIS — Z961 Presence of intraocular lens: Secondary | ICD-10-CM | POA: Diagnosis not present

## 2021-01-18 DIAGNOSIS — H26492 Other secondary cataract, left eye: Secondary | ICD-10-CM | POA: Diagnosis not present

## 2021-02-03 ENCOUNTER — Encounter: Payer: Self-pay | Admitting: Gastroenterology

## 2021-02-06 DIAGNOSIS — J029 Acute pharyngitis, unspecified: Secondary | ICD-10-CM | POA: Diagnosis not present

## 2021-02-06 DIAGNOSIS — J069 Acute upper respiratory infection, unspecified: Secondary | ICD-10-CM | POA: Diagnosis not present

## 2021-02-11 DIAGNOSIS — J029 Acute pharyngitis, unspecified: Secondary | ICD-10-CM | POA: Diagnosis not present

## 2021-02-18 DIAGNOSIS — J029 Acute pharyngitis, unspecified: Secondary | ICD-10-CM | POA: Diagnosis not present

## 2021-02-27 DIAGNOSIS — J309 Allergic rhinitis, unspecified: Secondary | ICD-10-CM | POA: Diagnosis not present

## 2021-02-27 DIAGNOSIS — R0789 Other chest pain: Secondary | ICD-10-CM | POA: Diagnosis not present

## 2021-03-08 ENCOUNTER — Encounter: Payer: Self-pay | Admitting: Gastroenterology

## 2021-03-08 ENCOUNTER — Other Ambulatory Visit: Payer: Self-pay

## 2021-03-08 ENCOUNTER — Ambulatory Visit (INDEPENDENT_AMBULATORY_CARE_PROVIDER_SITE_OTHER): Payer: PPO | Admitting: Gastroenterology

## 2021-03-08 ENCOUNTER — Other Ambulatory Visit (INDEPENDENT_AMBULATORY_CARE_PROVIDER_SITE_OTHER): Payer: PPO

## 2021-03-08 VITALS — Ht 64.0 in | Wt 110.0 lb

## 2021-03-08 DIAGNOSIS — R634 Abnormal weight loss: Secondary | ICD-10-CM

## 2021-03-08 DIAGNOSIS — K581 Irritable bowel syndrome with constipation: Secondary | ICD-10-CM

## 2021-03-08 DIAGNOSIS — K219 Gastro-esophageal reflux disease without esophagitis: Secondary | ICD-10-CM | POA: Diagnosis not present

## 2021-03-08 MED ORDER — AMBULATORY NON FORMULARY MEDICATION: 2 refills | Status: DC

## 2021-03-08 NOTE — Progress Notes (Signed)
Chief Complaint: Upper abdominal pain  Referring Provider:  No ref. provider found      ASSESSMENT AND PLAN;   #1. 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 Neg EGD with dil 03/2020 by Dr. 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 IBS-C with assoc anxiety  #3.  Upper abdo pain with wt loss 5lb   Plan: -Blood work from [facility]  AP with PO and IV contrast -Gas X BID with meals -GI cocktail 5-10 cc PO TID prn #250 ml -She will discuss treatment of anxiety with Haydee Salter -If continues to have problems, would consider UGI series followed by solid-phase gastric emptying scan.   HPI:    Catherine Stone is a 75 y.o. female  With postprandial upper abdominal pain associated with abdominal bloating.  Occurs right after eating.  Of note that she has history of HH s/p Nissen in past.  Recent EGD by Dr. 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  There is no evidence of portal hypertension on liver cirrhosis.  EGD also showed small hiatal hernia.  She underwent empiric esophageal dilatation.  She has lost 5 pounds over last 1 month.  He does complain of sore throat.  She has been to ENT in [facility] (Dr. Terie Purser in  past)  Has longstanding history of constipation.  Better with laxatives-Dulcolax and MiraLAX as needed.  She denies having any significant heartburn, odynophagia or dysphagia.  No fever chills or night sweats.  She does admit anxiety plays an important role in her GI symptoms.  Past GI work-up: EGD 04/07/2020 Dr. 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 pylori biopsies  Labs from 02/19/2021 Hb 13.5, MCV 89, platelets 236  Colonoscopy 08/2009 Colonic polyps s/p polypectomy, mild sigmoid diverticulosis, small internal hemorrhoids. Bx- TA She also had colonoscopy with Dr. Lyda Jester recently.  We do not have records currently.  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 Family history of colon cancer   . GERD (gastroesophageal reflux disease)   . History of colon polyps   . History of hepatitis B   . Hypercholesterolemia      Family History  Problem Relation Age of Onset  . Colon cancer Mother 68    Social History   Tobacco Use  . Smoking status: Never Smoker  . Smokeless tobacco: Never Used  Substance Use Topics  . Alcohol use: Never  . Drug use: Never    Current Outpatient Medications  Medication Sig Dispense Refill  . Alum & Mag Hydroxide-Simeth (MYLANTA PO) Take by mouth.    Marland Kitchen CALCIUM-MAGNESIUM-VITAMIN D PO Take by mouth.    . dicyclomine (BENTYL) 10 MG capsule Take by mouth.    . famotidine (PEPCID) 20 MG tablet Take 20 mg by mouth 2 (two) times daily.    Marland Kitchen gabapentin (NEURONTIN) 300 MG capsule     . METAMUCIL FIBER PO Take by mouth. I capful daily    . pantoprazole (PROTONIX) 40 MG tablet Take 1 tablet by mouth daily.    . pravastatin (PRAVACHOL) 10 MG tablet Take 10 mg by mouth every morning.    . promethazine (PHENERGAN) 12.5 MG tablet Take 12.5 mg by mouth every 6 (six) hours as needed for nausea or vomiting.    . Simethicone (GAS-X PO) Take by mouth.    Marland Kitchen  temazepam (RESTORIL) 15 MG capsule Take 15 mg by mouth at bedtime as needed for sleep.    Marland Kitchen zolpidem (AMBIEN) 10 MG tablet Take 10 mg by mouth at bedtime as needed.     No current facility-administered medications for this visit.    No Known Allergies  Review of Systems:  Constitutional: Denies fever, chills, diaphoresis, appetite change and has fatigue.  HEENT: Denies photophobia, eye pain, redness, hearing loss, ear pain, congestion, sore throat, rhinorrhea, sneezing, mouth sores, neck pain, neck stiffness and tinnitus.   Respiratory: Denies SOB, DOE, cough, chest tightness,  and wheezing.   Cardiovascular: Denies chest pain, palpitations and leg swelling.  Genitourinary:  Denies dysuria, urgency, frequency, hematuria, flank pain and difficulty urinating.  Musculoskeletal: Denies myalgias, back pain, joint swelling, arthralgias and gait problem.  Skin: No rash.  Neurological: Denies dizziness, seizures, syncope, weakness, light-headedness, numbness and headaches.  Hematological: Denies adenopathy. Easy bruising, personal or family bleeding history  Psychiatric/Behavioral: Has anxiety or depression     Physical Exam:    Ht 5\' 4"  (1.626 m)   Wt 110 lb (49.9 kg)   BMI 18.88 kg/m  Wt Readings from Last 3 Encounters:  03/08/21 110 lb (49.9 kg)   Constitutional:  Well-developed, in no acute distress. Psychiatric: Normal mood and affect. Behavior is normal. HEENT: Conjunctivae are normal. No scleral icterus. Cardiovascular: Normal rate, regular rhythm. No edema Pulmonary/chest: Effort normal and breath sounds normal. No wheezing, rales or rhonchi. Abdominal: Soft, nondistended. Nontender. Bowel sounds active throughout. There are no masses palpable. No hepatomegaly. Rectal: Deferred Neurological: Alert and oriented to person place and time. Skin: Skin is warm and dry. No rashes noted.    [name], MD 03/08/2021, 1:58 PM  Cc: No ref. provider found

## 2021-03-08 NOTE — Patient Instructions (Addendum)
If you are age 77 or older, your body mass index should be between 23-30. Your Body mass index is 18.88 kg/m. If this is out of the aforementioned range listed, please consider follow up with your Primary Care Provider.  If you are age 71 or younger, your body mass index should be between 19-25. Your Body mass index is 18.88 kg/m. If this is out of the aformentioned range listed, please consider follow up with your Primary Care Provider.   Please purchase the following medications over the counter and take as directed: Gas-X 1 tablet 2 times daily with meals  You have been scheduled for a CT [facility] of the abdomen and [facility] [place] [address] [address]).   You are scheduled on             at        . You should arrive 15 minutes prior to your appointment time for registration. Please follow the written instructions below on the day of your exam:  WARNING: IF YOU ARE ALLERGIC TO IODINE/X-RAY DYE, PLEASE NOTIFY RADIOLOGY IMMEDIATELY AT [phone]! YOU WILL BE GIVEN A 13 HOUR PREMEDICATION PREP.  1) Do not eat or drink anything after       (4 hours prior to your test) 2) You have been given 2 bottles of oral contrast to drink. The solution may taste better if refrigerated, but do NOT add ice or any other liquid to this solution. Shake well before drinking.    Drink 1 bottle of contrast @       (2 hours prior to your exam)  Drink 1 bottle of contrast @       (1 hour prior to your exam)  You may take any medications as prescribed with a small amount of water, if necessary. If you take any of the following medications: METFORMIN, GLUCOPHAGE, GLUCOVANCE, AVANDAMET, RIOMET, FORTAMET, [place] MET, JANUMET, GLUMETZA or METAGLIP, you MAY be asked to HOLD this medication 48 hours AFTER the exam.  The purpose of you drinking the oral contrast is to aid in the visualization of your intestinal tract. The contrast solution may cause some diarrhea.  Depending on your individual set of symptoms, you may also receive an intravenous injection of x-ray contrast/dye. Plan on being at [facility] for 30 minutes or longer, depending on the type of exam you are having performed.  This test typically takes 30-45 minutes to complete.  If you have any questions regarding your exam or if you need to reschedule, you may call the CT department at [phone] between the hours of 8:00 am and 5:00 pm, Monday-Friday.  ________________________________________________________________________  Please go to the lab on the 2nd floor [address] before you leave the office today.   Thank you,  Dr. Jackquline Denmark

## 2021-03-09 LAB — COMPREHENSIVE METABOLIC PANEL
ALT: 17 U/L (ref 0–35)
AST: 17 U/L (ref 0–37)
Albumin: 4.2 g/dL (ref 3.5–5.2)
Alkaline Phosphatase: 88 U/L (ref 39–117)
BUN: 23 mg/dL (ref 6–23)
CO2: 28 mEq/L (ref 19–32)
Calcium: 9.6 mg/dL (ref 8.4–10.5)
Chloride: 103 mEq/L (ref 96–112)
Creatinine, Ser: 0.89 mg/dL (ref 0.40–1.20)
GFR: 62.8 mL/min (ref >60.00)
Glucose, Bld: 102 mg/dL — ABNORMAL HIGH (ref 70–99)
Potassium: 3.9 mEq/L (ref 3.5–5.1)
Sodium: 141 mEq/L (ref 135–145)
Total Bilirubin: 0.3 mg/dL (ref 0.2–1.2)
Total Protein: 6.9 g/dL (ref 6.0–8.3)

## 2021-03-11 DIAGNOSIS — Z681 Body mass index (BMI) 19 or less, adult: Secondary | ICD-10-CM | POA: Diagnosis not present

## 2021-03-11 DIAGNOSIS — F5101 Primary insomnia: Secondary | ICD-10-CM | POA: Diagnosis not present

## 2021-03-11 DIAGNOSIS — J302 Other seasonal allergic rhinitis: Secondary | ICD-10-CM | POA: Diagnosis not present

## 2021-03-11 DIAGNOSIS — F419 Anxiety disorder, unspecified: Secondary | ICD-10-CM | POA: Diagnosis not present

## 2021-03-18 ENCOUNTER — Other Ambulatory Visit: Payer: Self-pay

## 2021-03-18 ENCOUNTER — Encounter (HOSPITAL_BASED_OUTPATIENT_CLINIC_OR_DEPARTMENT_OTHER): Payer: Self-pay

## 2021-03-18 ENCOUNTER — Ambulatory Visit (HOSPITAL_BASED_OUTPATIENT_CLINIC_OR_DEPARTMENT_OTHER)
Admission: RE | Admit: 2021-03-18 | Discharge: 2021-03-18 | Disposition: A | Discharge status: Home / Self Care | Payer: PPO | Source: Ambulatory Visit | Attending: Gastroenterology | Admitting: Gastroenterology

## 2021-03-18 DIAGNOSIS — K219 Gastro-esophageal reflux disease without esophagitis: Secondary | ICD-10-CM | POA: Diagnosis not present

## 2021-03-18 DIAGNOSIS — K581 Irritable bowel syndrome with constipation: Secondary | ICD-10-CM | POA: Insufficient documentation

## 2021-03-18 DIAGNOSIS — R109 Unspecified abdominal pain: Secondary | ICD-10-CM | POA: Diagnosis not present

## 2021-03-18 DIAGNOSIS — R634 Abnormal weight loss: Secondary | ICD-10-CM | POA: Diagnosis not present

## 2021-03-18 MED ORDER — IOHEXOL 300 MG/ML  SOLN
100.0000 mL | Freq: Once | INTRAMUSCULAR | Status: AC | PRN
  Administered 2021-03-18: 100 mL via INTRAVENOUS

## 2021-03-18 NOTE — Progress Notes (Signed)
Please inform the patient. CT-no acute abnormalities Does show complex 3.4 cm right adnexal cystic masslike lesion Plan: -Needs appointment with GYN (Dr Marin Roberts or 1st available in [place]) -Please send him the CT report  Send report to family physician

## 2021-03-22 DIAGNOSIS — R300 Dysuria: Secondary | ICD-10-CM | POA: Diagnosis not present

## 2021-03-22 DIAGNOSIS — K219 Gastro-esophageal reflux disease without esophagitis: Secondary | ICD-10-CM | POA: Diagnosis not present

## 2021-03-22 DIAGNOSIS — E785 Hyperlipidemia, unspecified: Secondary | ICD-10-CM | POA: Diagnosis not present

## 2021-03-22 DIAGNOSIS — R1013 Epigastric pain: Secondary | ICD-10-CM | POA: Diagnosis not present

## 2021-03-22 DIAGNOSIS — F5101 Primary insomnia: Secondary | ICD-10-CM | POA: Diagnosis not present

## 2021-03-26 DIAGNOSIS — Z9889 Other specified postprocedural states: Secondary | ICD-10-CM | POA: Diagnosis not present

## 2021-03-26 DIAGNOSIS — R070 Pain in throat: Secondary | ICD-10-CM | POA: Diagnosis not present

## 2021-03-26 DIAGNOSIS — H61303 Acquired stenosis of external ear canal, unspecified, bilateral: Secondary | ICD-10-CM | POA: Diagnosis not present

## 2021-03-26 DIAGNOSIS — J343 Hypertrophy of nasal turbinates: Secondary | ICD-10-CM | POA: Diagnosis not present

## 2021-03-26 DIAGNOSIS — R12 Heartburn: Secondary | ICD-10-CM | POA: Diagnosis not present

## 2021-03-26 DIAGNOSIS — J342 Deviated nasal septum: Secondary | ICD-10-CM | POA: Diagnosis not present

## 2021-03-26 DIAGNOSIS — K219 Gastro-esophageal reflux disease without esophagitis: Secondary | ICD-10-CM | POA: Diagnosis not present

## 2021-03-26 DIAGNOSIS — R1310 Dysphagia, unspecified: Secondary | ICD-10-CM | POA: Diagnosis not present

## 2021-03-26 DIAGNOSIS — Z87891 Personal history of nicotine dependence: Secondary | ICD-10-CM | POA: Diagnosis not present

## 2021-03-30 DIAGNOSIS — H26492 Other secondary cataract, left eye: Secondary | ICD-10-CM | POA: Diagnosis not present

## 2021-04-16 DIAGNOSIS — R1310 Dysphagia, unspecified: Secondary | ICD-10-CM | POA: Diagnosis not present

## 2021-04-17 NOTE — Addendum Note (Signed)
Encounter addended by: Annie Paras on: 04/17/2021 1:56 PM  Actions taken: Letter saved

## 2021-04-19 DIAGNOSIS — N83201 Unspecified ovarian cyst, right side: Secondary | ICD-10-CM | POA: Diagnosis not present

## 2021-04-29 DIAGNOSIS — K219 Gastro-esophageal reflux disease without esophagitis: Secondary | ICD-10-CM | POA: Diagnosis not present

## 2021-04-29 DIAGNOSIS — R1313 Dysphagia, pharyngeal phase: Secondary | ICD-10-CM | POA: Diagnosis not present

## 2021-04-29 DIAGNOSIS — M2578 Osteophyte, vertebrae: Secondary | ICD-10-CM | POA: Diagnosis not present

## 2021-05-19 ENCOUNTER — Ambulatory Visit (INDEPENDENT_AMBULATORY_CARE_PROVIDER_SITE_OTHER): Payer: PPO | Admitting: Gastroenterology

## 2021-05-19 ENCOUNTER — Other Ambulatory Visit: Payer: Self-pay

## 2021-05-19 ENCOUNTER — Encounter: Payer: Self-pay | Admitting: Gastroenterology

## 2021-05-19 VITALS — BP 108/72 | HR 70 | Ht 64.0 in | Wt 111.4 lb

## 2021-05-19 DIAGNOSIS — K219 Gastro-esophageal reflux disease without esophagitis: Secondary | ICD-10-CM

## 2021-05-19 DIAGNOSIS — R1010 Upper abdominal pain, unspecified: Secondary | ICD-10-CM | POA: Diagnosis not present

## 2021-05-19 DIAGNOSIS — Z9889 Other specified postprocedural states: Secondary | ICD-10-CM

## 2021-05-19 DIAGNOSIS — K581 Irritable bowel syndrome with constipation: Secondary | ICD-10-CM

## 2021-05-19 DIAGNOSIS — K649 Unspecified hemorrhoids: Secondary | ICD-10-CM | POA: Diagnosis not present

## 2021-05-19 DIAGNOSIS — Z8719 Personal history of other diseases of the digestive system: Secondary | ICD-10-CM

## 2021-05-19 MED ORDER — FLUTICASONE PROPIONATE 0.05 % EX CREA: TOPICAL_CREAM | Freq: Two times a day (BID) | CUTANEOUS | 2 refills | Status: DC

## 2021-05-19 NOTE — Patient Instructions (Addendum)
If you are age 77 or older, your body mass index should be between 23-30. Your Body mass index is 19.12 kg/m. If this is out of the aforementioned range listed, please consider follow up with your Primary Care Provider.  If you are age 65 or younger, your body mass index should be between 19-25. Your Body mass index is 19.12 kg/m. If this is out of the aformentioned range listed, please consider follow up with your Primary Care Provider.   __________________________________________________________  The [facility] providers would like to encourage you to use [facility] to communicate with providers for non-urgent requests or questions.  Due to long hold times on the telephone, sending your provider a message by [facility] may be a faster and more efficient way to get a response.  Please allow 48 business hours for a response.  Please remember that this is for non-urgent requests.   We have sent the following medications to your pharmacy for you to pick up at your convenience: Fluticasone cream  You are being referred to [facility]. There number is [phone] if you haven't heard from them in 2 weeks.  Continue Protonix.  Thank you,  Dr. Jackquline Denmark

## 2021-05-19 NOTE — Progress Notes (Signed)
Chief Complaint: Upper abdominal pain  Referring Provider:  No ref. provider found      ASSESSMENT AND PLAN;   #1. 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 Neg EGD with dil 03/2020 by Dr. 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 No portal hypertension or cirrhosis on recent CT 02/2021  #2. IBS-C with assoc anxiety  #3. Chronic upper abdo pain. Neg CT AP 02/2021.  Very much concerned about "adhesions".  Would like to see Dr. Hassell Done.  #4.  Small internal Hoids on rectal exam today  Plan: -Protonix 40 q am to continue -Last colon report from [facility] (Dr. Lyda Jester) -fluticasone cream 0.05% generic 30g 1 bid PR x 10 days, 2 refills -Appt with Dr Hassell Done (Sx) at pt's request -Gas X BID with meals to continue -If continues to have problems, would consider UGI series followed by solid-phase gastric emptying scan.  Addendum -We got more records from [facility] this afternoon.  I only see EGD with dilatation on 04/07/2020.  I do see colonoscopy from October 2010 which showed 1 cm polyp s/p polypectomy. Bx-tubular adenoma.  Per patient she had recent colonoscopy by Dr. Lyda Jester.  I do not see that report.  We will again get in touch with [facility]. HPI:    Catherine Stone is a 75 y.o. female   For follow-up visit. She has been doing better from GI standpoint. Postprandial abdominal bloating is better with Gas-X 3 times daily with meals and protonix.  She could not tolerate GI cocktail.  Underwent CT Abdo/pelvis as below which showed 3.4 cm right adnexal cystic lesion.  She has seen Dr. Gaston Islam is scheduled to have pelvic ultrasound and CA 19-9.  There is no evidence of liver cirrhosis or small bowel obstruction on CT.  Catherine Stone would like to see Dr. Hassell Done for another opinion regarding adhesions.  Her anxiety is somewhat better.  She had problems with hemorrhoids "coming out" 2 weeks ago.  She used Preparation H suppositories with good results.  She would like  to have a stronger cream.  She denies having any diarrhea or constipation recently.   Extensive previous GI work-up as below.    From previous note: -H/O HH s/p Nissen in past. EGD by Dr. 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 No portal hypertension or liver cirrhosis.  EGD also showed small hiatal hernia.  She underwent empiric esophageal dilatation. -Has longstanding constipation.  Better with laxatives-Dulcolax and MiraLAX as needed. -She does admit anxiety plays an important role in her GI symptoms.  Past GI work-up: EGD 04/07/2020 Dr. 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 pylori biopsies  Labs from 02/19/2021 Hb 13.5, MCV 89, platelets 236  Colonoscopy 08/2009 Colonic polyps s/p polypectomy, mild sigmoid diverticulosis, small internal hemorrhoids. Bx- TA She also had colonoscopy with Dr. Lyda Jester recently.  We do not have records currently.  CT AP with contrast 02/2021 1. No acute abdominopelvic findings. 2. No significant change in size of the several cystic structures in the right adnexa with the largest measuring 3.4 cm in maximum axial dimension. Recommend further evaluation with pelvic ultrasound. 3. Sigmoid colonic diverticula without evidence of acute diverticulitis. 4. Aortic atherosclerosis.  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     CALCIUM-MAGNESIUM-VITAMIN D PO Take by mouth.     dicyclomine (BENTYL) 10 MG capsule Take by mouth.     famotidine (PEPCID) 20 MG tablet Take 20 mg by mouth 2 (two) times daily.     gabapentin (NEURONTIN) 300 MG capsule      METAMUCIL FIBER PO Take by mouth. I capful daily     pantoprazole (PROTONIX) 40 MG tablet Take 1 tablet by mouth daily.     pravastatin (PRAVACHOL) 10 MG tablet Take 10 mg by mouth every morning.     promethazine (PHENERGAN) 12.5 MG tablet Take 12.5 mg by mouth every 6 (six) hours as needed for nausea or vomiting.     Simethicone (GAS-X PO) Take by mouth.     temazepam (RESTORIL) 15 MG capsule Take 15 mg by mouth at bedtime as needed for sleep.     zolpidem (AMBIEN) 10 MG tablet Take 10 mg by mouth at bedtime as needed.     No current facility-administered medications for this visit.    No Known Allergies  Review of Systems:  Psychiatric/Behavioral: Has anxiety or depression     Physical Exam:    Ht 5\' 4"  (1.626 m)   Wt 110 lb (49.9 kg)   BMI 18.88 kg/m  Wt Readings from Last 3 Encounters:  03/08/21 110 lb (49.9 kg)   Constitutional:  Well-developed, in no acute distress. Psychiatric: Normal mood and affect. Behavior is normal. HEENT: Conjunctivae are normal. No scleral icterus. Cardiovascular: Normal rate, regular rhythm. No edema Pulmonary/chest: Effort normal and breath sounds normal. No wheezing, rales or rhonchi. Abdominal: Soft, nondistended. Nontender. Bowel sounds active throughout. There are no masses palpable. No hepatomegaly. Rectal: Deferred Neurological: Alert and oriented to person place and time. Skin: Skin is warm and dry. No rashes noted.    [name], MD 03/08/2021, 1:58 PM  Cc: Catherine Salter NP

## 2021-05-25 DIAGNOSIS — N83202 Unspecified ovarian cyst, left side: Secondary | ICD-10-CM | POA: Diagnosis not present

## 2021-05-25 DIAGNOSIS — N83201 Unspecified ovarian cyst, right side: Secondary | ICD-10-CM | POA: Diagnosis not present

## 2021-06-29 DIAGNOSIS — L570 Actinic keratosis: Secondary | ICD-10-CM | POA: Diagnosis not present

## 2021-06-29 DIAGNOSIS — L821 Other seborrheic keratosis: Secondary | ICD-10-CM | POA: Diagnosis not present

## 2021-06-29 DIAGNOSIS — L720 Epidermal cyst: Secondary | ICD-10-CM | POA: Diagnosis not present

## 2021-06-29 DIAGNOSIS — L578 Other skin changes due to chronic exposure to nonionizing radiation: Secondary | ICD-10-CM | POA: Diagnosis not present

## 2021-07-08 DIAGNOSIS — H26491 Other secondary cataract, right eye: Secondary | ICD-10-CM | POA: Diagnosis not present

## 2021-07-08 DIAGNOSIS — H353132 Nonexudative age-related macular degeneration, bilateral, intermediate dry stage: Secondary | ICD-10-CM | POA: Diagnosis not present

## 2021-07-26 DIAGNOSIS — I700 Atherosclerosis of aorta: Secondary | ICD-10-CM | POA: Diagnosis not present

## 2021-07-26 DIAGNOSIS — K219 Gastro-esophageal reflux disease without esophagitis: Secondary | ICD-10-CM | POA: Diagnosis not present

## 2021-07-26 DIAGNOSIS — Z79899 Other long term (current) drug therapy: Secondary | ICD-10-CM | POA: Diagnosis not present

## 2021-07-26 DIAGNOSIS — Z681 Body mass index (BMI) 19 or less, adult: Secondary | ICD-10-CM | POA: Diagnosis not present

## 2021-07-26 DIAGNOSIS — E785 Hyperlipidemia, unspecified: Secondary | ICD-10-CM | POA: Diagnosis not present

## 2021-07-26 DIAGNOSIS — F5101 Primary insomnia: Secondary | ICD-10-CM | POA: Diagnosis not present

## 2021-07-30 DIAGNOSIS — K219 Gastro-esophageal reflux disease without esophagitis: Secondary | ICD-10-CM | POA: Diagnosis not present

## 2021-07-30 DIAGNOSIS — Z9889 Other specified postprocedural states: Secondary | ICD-10-CM | POA: Diagnosis not present

## 2021-08-06 ENCOUNTER — Other Ambulatory Visit: Payer: Self-pay | Admitting: Surgery

## 2021-08-06 DIAGNOSIS — Z9889 Other specified postprocedural states: Secondary | ICD-10-CM

## 2021-08-06 DIAGNOSIS — K219 Gastro-esophageal reflux disease without esophagitis: Secondary | ICD-10-CM

## 2021-08-23 ENCOUNTER — Ambulatory Visit
Admission: RE | Admit: 2021-08-23 | Discharge: 2021-08-23 | Disposition: A | Discharge status: Home / Self Care | Payer: PPO | Source: Ambulatory Visit | Attending: Surgery | Admitting: Surgery

## 2021-08-23 DIAGNOSIS — Z9889 Other specified postprocedural states: Secondary | ICD-10-CM

## 2021-08-23 DIAGNOSIS — K219 Gastro-esophageal reflux disease without esophagitis: Secondary | ICD-10-CM

## 2021-08-23 DIAGNOSIS — R1012 Left upper quadrant pain: Secondary | ICD-10-CM | POA: Diagnosis not present

## 2021-09-17 DIAGNOSIS — R1010 Upper abdominal pain, unspecified: Secondary | ICD-10-CM | POA: Diagnosis not present

## 2021-09-29 DIAGNOSIS — F331 Major depressive disorder, recurrent, moderate: Secondary | ICD-10-CM | POA: Diagnosis not present

## 2021-09-29 DIAGNOSIS — R1010 Upper abdominal pain, unspecified: Secondary | ICD-10-CM | POA: Diagnosis not present

## 2021-09-29 DIAGNOSIS — Z1339 Encounter for screening examination for other mental health and behavioral disorders: Secondary | ICD-10-CM | POA: Diagnosis not present

## 2021-09-29 DIAGNOSIS — E2839 Other primary ovarian failure: Secondary | ICD-10-CM | POA: Diagnosis not present

## 2021-09-29 DIAGNOSIS — K5900 Constipation, unspecified: Secondary | ICD-10-CM | POA: Diagnosis not present

## 2021-09-29 DIAGNOSIS — Z1331 Encounter for screening for depression: Secondary | ICD-10-CM | POA: Diagnosis not present

## 2021-09-29 DIAGNOSIS — Z0001 Encounter for general adult medical examination with abnormal findings: Secondary | ICD-10-CM | POA: Diagnosis not present

## 2021-09-30 DIAGNOSIS — L821 Other seborrheic keratosis: Secondary | ICD-10-CM | POA: Diagnosis not present

## 2021-09-30 DIAGNOSIS — L570 Actinic keratosis: Secondary | ICD-10-CM | POA: Diagnosis not present

## 2021-09-30 DIAGNOSIS — L578 Other skin changes due to chronic exposure to nonionizing radiation: Secondary | ICD-10-CM | POA: Diagnosis not present

## 2021-09-30 DIAGNOSIS — L820 Inflamed seborrheic keratosis: Secondary | ICD-10-CM | POA: Diagnosis not present

## 2021-10-27 DIAGNOSIS — K219 Gastro-esophageal reflux disease without esophagitis: Secondary | ICD-10-CM | POA: Diagnosis not present

## 2021-10-27 DIAGNOSIS — E785 Hyperlipidemia, unspecified: Secondary | ICD-10-CM | POA: Diagnosis not present

## 2021-10-27 DIAGNOSIS — F419 Anxiety disorder, unspecified: Secondary | ICD-10-CM | POA: Diagnosis not present

## 2021-11-10 DIAGNOSIS — F419 Anxiety disorder, unspecified: Secondary | ICD-10-CM | POA: Diagnosis not present

## 2021-11-10 DIAGNOSIS — K5904 Chronic idiopathic constipation: Secondary | ICD-10-CM | POA: Diagnosis not present

## 2021-12-06 DIAGNOSIS — F331 Major depressive disorder, recurrent, moderate: Secondary | ICD-10-CM | POA: Diagnosis not present

## 2021-12-06 DIAGNOSIS — F5101 Primary insomnia: Secondary | ICD-10-CM | POA: Diagnosis not present

## 2021-12-06 DIAGNOSIS — Z681 Body mass index (BMI) 19 or less, adult: Secondary | ICD-10-CM | POA: Diagnosis not present

## 2021-12-06 DIAGNOSIS — I700 Atherosclerosis of aorta: Secondary | ICD-10-CM | POA: Diagnosis not present

## 2021-12-06 DIAGNOSIS — F419 Anxiety disorder, unspecified: Secondary | ICD-10-CM | POA: Diagnosis not present

## 2021-12-27 DIAGNOSIS — M1712 Unilateral primary osteoarthritis, left knee: Secondary | ICD-10-CM | POA: Diagnosis not present

## 2021-12-27 DIAGNOSIS — M1711 Unilateral primary osteoarthritis, right knee: Secondary | ICD-10-CM | POA: Diagnosis not present

## 2022-02-11 ENCOUNTER — Encounter: Payer: Self-pay | Admitting: Gastroenterology

## 2022-02-15 NOTE — Telephone Encounter (Signed)
Awesome ?I am glad you feel better ?RG ?

## 2022-04-07 DIAGNOSIS — E785 Hyperlipidemia, unspecified: Secondary | ICD-10-CM | POA: Diagnosis not present

## 2022-04-07 DIAGNOSIS — F331 Major depressive disorder, recurrent, moderate: Secondary | ICD-10-CM | POA: Diagnosis not present

## 2022-04-07 DIAGNOSIS — F5101 Primary insomnia: Secondary | ICD-10-CM | POA: Diagnosis not present

## 2022-04-07 DIAGNOSIS — Z681 Body mass index (BMI) 19 or less, adult: Secondary | ICD-10-CM | POA: Diagnosis not present

## 2022-04-07 DIAGNOSIS — Z79899 Other long term (current) drug therapy: Secondary | ICD-10-CM | POA: Diagnosis not present

## 2022-04-07 DIAGNOSIS — K219 Gastro-esophageal reflux disease without esophagitis: Secondary | ICD-10-CM | POA: Diagnosis not present

## 2022-05-18 DIAGNOSIS — M25511 Pain in right shoulder: Secondary | ICD-10-CM | POA: Diagnosis not present

## 2022-05-24 DIAGNOSIS — M545 Low back pain, unspecified: Secondary | ICD-10-CM | POA: Diagnosis not present

## 2022-05-27 DIAGNOSIS — K219 Gastro-esophageal reflux disease without esophagitis: Secondary | ICD-10-CM | POA: Diagnosis not present

## 2022-05-27 DIAGNOSIS — E785 Hyperlipidemia, unspecified: Secondary | ICD-10-CM | POA: Diagnosis not present

## 2022-05-27 DIAGNOSIS — F419 Anxiety disorder, unspecified: Secondary | ICD-10-CM | POA: Diagnosis not present

## 2022-06-01 DIAGNOSIS — M545 Low back pain, unspecified: Secondary | ICD-10-CM | POA: Diagnosis not present

## 2022-06-07 DIAGNOSIS — M545 Low back pain, unspecified: Secondary | ICD-10-CM | POA: Diagnosis not present

## 2022-06-16 DIAGNOSIS — E785 Hyperlipidemia, unspecified: Secondary | ICD-10-CM | POA: Diagnosis not present

## 2022-06-16 DIAGNOSIS — Z681 Body mass index (BMI) 19 or less, adult: Secondary | ICD-10-CM | POA: Diagnosis not present

## 2022-06-16 DIAGNOSIS — Z7689 Persons encountering health services in other specified circumstances: Secondary | ICD-10-CM | POA: Diagnosis not present

## 2022-06-16 DIAGNOSIS — G629 Polyneuropathy, unspecified: Secondary | ICD-10-CM | POA: Diagnosis not present

## 2022-06-16 DIAGNOSIS — F5101 Primary insomnia: Secondary | ICD-10-CM | POA: Diagnosis not present

## 2022-07-21 DIAGNOSIS — L570 Actinic keratosis: Secondary | ICD-10-CM | POA: Diagnosis not present

## 2022-07-28 DIAGNOSIS — G629 Polyneuropathy, unspecified: Secondary | ICD-10-CM | POA: Diagnosis not present

## 2022-07-28 DIAGNOSIS — G8929 Other chronic pain: Secondary | ICD-10-CM | POA: Diagnosis not present

## 2022-07-28 DIAGNOSIS — F5104 Psychophysiologic insomnia: Secondary | ICD-10-CM | POA: Diagnosis not present

## 2022-07-28 DIAGNOSIS — Z681 Body mass index (BMI) 19 or less, adult: Secondary | ICD-10-CM | POA: Diagnosis not present

## 2022-07-28 DIAGNOSIS — E785 Hyperlipidemia, unspecified: Secondary | ICD-10-CM | POA: Diagnosis not present

## 2022-07-28 DIAGNOSIS — M549 Dorsalgia, unspecified: Secondary | ICD-10-CM | POA: Diagnosis not present

## 2022-08-04 DIAGNOSIS — S29012A Strain of muscle and tendon of back wall of thorax, initial encounter: Secondary | ICD-10-CM | POA: Diagnosis not present

## 2022-08-04 DIAGNOSIS — Z681 Body mass index (BMI) 19 or less, adult: Secondary | ICD-10-CM | POA: Diagnosis not present

## 2022-08-17 DIAGNOSIS — M549 Dorsalgia, unspecified: Secondary | ICD-10-CM | POA: Diagnosis not present

## 2022-08-17 DIAGNOSIS — E785 Hyperlipidemia, unspecified: Secondary | ICD-10-CM | POA: Diagnosis not present

## 2022-08-17 DIAGNOSIS — F5101 Primary insomnia: Secondary | ICD-10-CM | POA: Diagnosis not present

## 2022-08-17 DIAGNOSIS — G629 Polyneuropathy, unspecified: Secondary | ICD-10-CM | POA: Diagnosis not present

## 2022-08-17 DIAGNOSIS — Z681 Body mass index (BMI) 19 or less, adult: Secondary | ICD-10-CM | POA: Diagnosis not present

## 2022-08-23 DIAGNOSIS — M2560 Stiffness of unspecified joint, not elsewhere classified: Secondary | ICD-10-CM | POA: Diagnosis not present

## 2022-08-23 DIAGNOSIS — M545 Low back pain, unspecified: Secondary | ICD-10-CM | POA: Diagnosis not present

## 2022-08-23 DIAGNOSIS — R531 Weakness: Secondary | ICD-10-CM | POA: Diagnosis not present

## 2022-08-29 DIAGNOSIS — H353132 Nonexudative age-related macular degeneration, bilateral, intermediate dry stage: Secondary | ICD-10-CM | POA: Diagnosis not present

## 2022-08-30 DIAGNOSIS — M2560 Stiffness of unspecified joint, not elsewhere classified: Secondary | ICD-10-CM | POA: Diagnosis not present

## 2022-08-30 DIAGNOSIS — M545 Low back pain, unspecified: Secondary | ICD-10-CM | POA: Diagnosis not present

## 2022-08-30 DIAGNOSIS — R531 Weakness: Secondary | ICD-10-CM | POA: Diagnosis not present

## 2022-09-01 DIAGNOSIS — R531 Weakness: Secondary | ICD-10-CM | POA: Diagnosis not present

## 2022-09-01 DIAGNOSIS — M545 Low back pain, unspecified: Secondary | ICD-10-CM | POA: Diagnosis not present

## 2022-09-01 DIAGNOSIS — M2560 Stiffness of unspecified joint, not elsewhere classified: Secondary | ICD-10-CM | POA: Diagnosis not present

## 2022-09-06 DIAGNOSIS — Z136 Encounter for screening for cardiovascular disorders: Secondary | ICD-10-CM | POA: Diagnosis not present

## 2022-09-06 DIAGNOSIS — Z0000 Encounter for general adult medical examination without abnormal findings: Secondary | ICD-10-CM | POA: Diagnosis not present

## 2022-09-06 DIAGNOSIS — R531 Weakness: Secondary | ICD-10-CM | POA: Diagnosis not present

## 2022-09-06 DIAGNOSIS — Z139 Encounter for screening, unspecified: Secondary | ICD-10-CM | POA: Diagnosis not present

## 2022-09-06 DIAGNOSIS — Z1331 Encounter for screening for depression: Secondary | ICD-10-CM | POA: Diagnosis not present

## 2022-09-06 DIAGNOSIS — M545 Low back pain, unspecified: Secondary | ICD-10-CM | POA: Diagnosis not present

## 2022-09-06 DIAGNOSIS — Z1389 Encounter for screening for other disorder: Secondary | ICD-10-CM | POA: Diagnosis not present

## 2022-09-06 DIAGNOSIS — M2560 Stiffness of unspecified joint, not elsewhere classified: Secondary | ICD-10-CM | POA: Diagnosis not present

## 2022-09-08 DIAGNOSIS — M2560 Stiffness of unspecified joint, not elsewhere classified: Secondary | ICD-10-CM | POA: Diagnosis not present

## 2022-09-08 DIAGNOSIS — M545 Low back pain, unspecified: Secondary | ICD-10-CM | POA: Diagnosis not present

## 2022-09-08 DIAGNOSIS — R531 Weakness: Secondary | ICD-10-CM | POA: Diagnosis not present

## 2022-11-10 DIAGNOSIS — R1013 Epigastric pain: Secondary | ICD-10-CM | POA: Diagnosis not present

## 2022-11-10 DIAGNOSIS — R079 Chest pain, unspecified: Secondary | ICD-10-CM | POA: Diagnosis not present

## 2022-11-10 DIAGNOSIS — K2970 Gastritis, unspecified, without bleeding: Secondary | ICD-10-CM | POA: Diagnosis not present

## 2022-11-16 DIAGNOSIS — M5136 Other intervertebral disc degeneration, lumbar region: Secondary | ICD-10-CM | POA: Diagnosis not present

## 2022-11-16 DIAGNOSIS — M9902 Segmental and somatic dysfunction of thoracic region: Secondary | ICD-10-CM | POA: Diagnosis not present

## 2022-11-16 DIAGNOSIS — M9905 Segmental and somatic dysfunction of pelvic region: Secondary | ICD-10-CM | POA: Diagnosis not present

## 2022-11-16 DIAGNOSIS — M9903 Segmental and somatic dysfunction of lumbar region: Secondary | ICD-10-CM | POA: Diagnosis not present

## 2022-12-21 DIAGNOSIS — Z139 Encounter for screening, unspecified: Secondary | ICD-10-CM | POA: Diagnosis not present

## 2022-12-21 DIAGNOSIS — Z9889 Other specified postprocedural states: Secondary | ICD-10-CM | POA: Diagnosis not present

## 2022-12-21 DIAGNOSIS — M549 Dorsalgia, unspecified: Secondary | ICD-10-CM | POA: Diagnosis not present

## 2022-12-21 DIAGNOSIS — R1012 Left upper quadrant pain: Secondary | ICD-10-CM | POA: Diagnosis not present

## 2022-12-21 DIAGNOSIS — Z1331 Encounter for screening for depression: Secondary | ICD-10-CM | POA: Diagnosis not present

## 2022-12-21 DIAGNOSIS — Z6820 Body mass index (BMI) 20.0-20.9, adult: Secondary | ICD-10-CM | POA: Diagnosis not present

## 2022-12-21 DIAGNOSIS — M546 Pain in thoracic spine: Secondary | ICD-10-CM | POA: Diagnosis not present

## 2022-12-21 DIAGNOSIS — G8929 Other chronic pain: Secondary | ICD-10-CM | POA: Diagnosis not present

## 2023-01-03 DIAGNOSIS — Z9889 Other specified postprocedural states: Secondary | ICD-10-CM | POA: Diagnosis not present

## 2023-01-03 DIAGNOSIS — G8929 Other chronic pain: Secondary | ICD-10-CM | POA: Diagnosis not present

## 2023-01-03 DIAGNOSIS — Z6820 Body mass index (BMI) 20.0-20.9, adult: Secondary | ICD-10-CM | POA: Diagnosis not present

## 2023-01-03 DIAGNOSIS — Z139 Encounter for screening, unspecified: Secondary | ICD-10-CM | POA: Diagnosis not present

## 2023-01-03 DIAGNOSIS — R1012 Left upper quadrant pain: Secondary | ICD-10-CM | POA: Diagnosis not present

## 2023-01-03 DIAGNOSIS — H26491 Other secondary cataract, right eye: Secondary | ICD-10-CM | POA: Diagnosis not present

## 2023-01-03 DIAGNOSIS — M47814 Spondylosis without myelopathy or radiculopathy, thoracic region: Secondary | ICD-10-CM | POA: Diagnosis not present

## 2023-01-17 ENCOUNTER — Ambulatory Visit: Payer: PPO | Admitting: Gastroenterology

## 2023-03-10 DIAGNOSIS — E2839 Other primary ovarian failure: Secondary | ICD-10-CM | POA: Diagnosis not present

## 2023-03-10 DIAGNOSIS — M8589 Other specified disorders of bone density and structure, multiple sites: Secondary | ICD-10-CM | POA: Diagnosis not present

## 2023-04-11 DIAGNOSIS — M47814 Spondylosis without myelopathy or radiculopathy, thoracic region: Secondary | ICD-10-CM | POA: Diagnosis not present

## 2023-04-11 DIAGNOSIS — R109 Unspecified abdominal pain: Secondary | ICD-10-CM | POA: Diagnosis not present

## 2023-04-11 DIAGNOSIS — G8929 Other chronic pain: Secondary | ICD-10-CM | POA: Diagnosis not present

## 2023-04-11 DIAGNOSIS — Z6820 Body mass index (BMI) 20.0-20.9, adult: Secondary | ICD-10-CM | POA: Diagnosis not present

## 2023-04-11 DIAGNOSIS — Z6830 Body mass index (BMI) 30.0-30.9, adult: Secondary | ICD-10-CM | POA: Diagnosis not present

## 2023-04-11 DIAGNOSIS — R1012 Left upper quadrant pain: Secondary | ICD-10-CM | POA: Diagnosis not present

## 2023-04-11 DIAGNOSIS — G629 Polyneuropathy, unspecified: Secondary | ICD-10-CM | POA: Diagnosis not present

## 2023-05-04 ENCOUNTER — Encounter: Payer: Self-pay | Admitting: Physical Medicine & Rehabilitation

## 2023-05-10 ENCOUNTER — Other Ambulatory Visit (HOSPITAL_COMMUNITY): Payer: Self-pay | Admitting: Surgery

## 2023-05-10 DIAGNOSIS — M5414 Radiculopathy, thoracic region: Secondary | ICD-10-CM | POA: Diagnosis not present

## 2023-05-14 ENCOUNTER — Ambulatory Visit (HOSPITAL_COMMUNITY)
Admission: RE | Admit: 2023-05-14 | Discharge: 2023-05-14 | Disposition: A | Discharge status: Home / Self Care | Payer: PPO | Source: Ambulatory Visit | Attending: Surgery | Admitting: Surgery

## 2023-05-14 DIAGNOSIS — M5414 Radiculopathy, thoracic region: Secondary | ICD-10-CM | POA: Diagnosis not present

## 2023-05-14 DIAGNOSIS — M546 Pain in thoracic spine: Secondary | ICD-10-CM | POA: Diagnosis not present

## 2023-05-14 DIAGNOSIS — M40204 Unspecified kyphosis, thoracic region: Secondary | ICD-10-CM | POA: Diagnosis not present

## 2023-05-14 MED ORDER — GADOBUTROL 1 MMOL/ML IV SOLN
5.0000 mL | Freq: Once | INTRAVENOUS | Status: AC | PRN
  Administered 2023-05-14: 5 mL via INTRAVENOUS

## 2023-05-25 DIAGNOSIS — R1012 Left upper quadrant pain: Secondary | ICD-10-CM | POA: Diagnosis not present

## 2023-05-25 DIAGNOSIS — M5414 Radiculopathy, thoracic region: Secondary | ICD-10-CM | POA: Diagnosis not present

## 2023-06-02 ENCOUNTER — Telehealth: Payer: Self-pay

## 2023-06-02 ENCOUNTER — Encounter: Payer: PPO | Attending: Physical Medicine & Rehabilitation | Admitting: Physical Medicine & Rehabilitation

## 2023-06-02 VITALS — BP 115/70 | HR 83 | Ht 64.0 in | Wt 113.0 lb

## 2023-06-02 DIAGNOSIS — R1012 Left upper quadrant pain: Secondary | ICD-10-CM | POA: Insufficient documentation

## 2023-06-02 DIAGNOSIS — M546 Pain in thoracic spine: Secondary | ICD-10-CM | POA: Diagnosis present

## 2023-06-02 DIAGNOSIS — G8929 Other chronic pain: Secondary | ICD-10-CM | POA: Insufficient documentation

## 2023-06-02 MED ORDER — PREGABALIN 50 MG PO CAPS: 50.0000 mg | ORAL_CAPSULE | Freq: Three times a day (TID) | ORAL | 3 refills | Status: DC

## 2023-06-02 NOTE — Progress Notes (Signed)
Subjective:    Patient ID: Catherine Stone, female    DOB: August 11, 1946, 77 y.o.   MRN: [id number]  HPI  June 20 23, - unchanged,  Burning in the back  Ocasional pain in her anterior abdomen- worsening back part, squeezing - nissen funduplications 1995 Worse at night Adhesions in 1996 ish  Anxious Chronic constipation Tried different diets without changes  No rash at the time  Pain to lie on her back    HPI  Catherine Stone is a 77 y.o. year old female  who  has a past medical history of Family history of colon cancer, GERD (gastroesophageal reflux disease), History of colon polyps, History of hepatitis B, and Hypercholesterolemia.   They are presenting to PM&R clinic as a new patient for pain management evaluation. They were referred by Dr Lezlie Lye for treatment of thoracic pain.    Red flag symptoms: No red flags for back pain endorsed in Hx or ROS  Medications tried: Topical medications - lidocaine patches Nsaids - doesn't help Tylenol  -doesn't help Opiates- not tried Gabapentin - taking for years, didn't help at 300mg  BID  Lyrica- has not tired yet TCAs  possible poor reaction to this? SNRIs - Cymbalta Fatigued, didn't help the pain  2 muscle relaxers didn't help  Other treatments: PT/OT  - 2 visits OT didn't helps    Prior UDS results: No results found for: "LABOPIA", "COCAINSCRNUR", "LABBENZ", "AMPHETMU", "THCU", "LABBARB"    Pain Inventory Average Pain 5 Pain Right Now 5 My pain is sharp and burning  In the last 24 hours, has pain interfered with the following? General activity 4 Relation with others 0 Enjoyment of life 4 What TIME of day is your pain at its worst? evening Sleep (in general) Fair  Pain is worse with: inactivity Pain improves with:  . Relief from Meds: 0  how many minutes can you walk? unlimited ability to climb steps?  yes do you drive?  yes  retired  anxiety  New pt  New pt    Family History  Problem Relation  Age of Onset  . Colon cancer Mother 76   Social History   Socioeconomic History  . Marital status: Married    Spouse name: [name]  . Number of children: 4  . Years of education: Not on file  . Highest education level: Not on file  Occupational History  . Occupation: retired  Tobacco Use  . Smoking status: Never  . Smokeless tobacco: Never  Vaping Use  . Vaping Use: Never used  Substance and Sexual Activity  . Alcohol use: Never  . Drug use: Never  . Sexual activity: Not on file  Other Topics Concern  . Not on file  Social History Narrative  . Not on file   Social Determinants of Health   Financial Resource Strain: Not on file  Food Insecurity: Not on file  Transportation Needs: Not on file  Physical Activity: Not on file  Stress: Not on file  Social Connections: Not on file   Past Surgical History:  Procedure Laterality Date  . COLONOSCOPY  09/23/2009   Colonic polyps status post polpectomy. Mild sigmoid diverticulosis. Small hemorrhoids.  . ESOPHAGOGASTRODUODENOSCOPY  04/07/2020   Dr Jennye Boroughs. Possible early gastric antral vascular ectatic lesions. Evidence of prior fundoplication. Small hiatal hernia. Empiric esophageal dilation to [address] [place]  . KNEE ARTHROSCOPY    . LARYNGOPLASTY Bilateral   . NISSEN FUNDOPLICATION  1995  . ROTATOR CUFF REPAIR Right 2006  [facility]   Past Medical History:  Diagnosis Date  . Family history of colon cancer   . GERD (gastroesophageal reflux disease)   . History of colon polyps   . History of hepatitis B   . Hypercholesterolemia    BP 115/70   Pulse 83   Ht 5\' 4"  (1.626 m)   Wt 113 lb (51.3 kg)   SpO2 95%   BMI 19.40 kg/m   Opioid Risk Score:   Fall Risk Score:  `1  Depression screen [facility] 2/9     06/02/2023    1:57 PM  Depression screen PHQ 2/9  Decreased Interest 1  Down, Depressed, Hopeless 1  PHQ - 2 Score 2  Altered sleeping 3  Tired, decreased energy 1  Change in appetite 0  Feeling bad or failure  about yourself  0  Trouble concentrating 1  Moving slowly or fidgety/restless 0  Suicidal thoughts 0  PHQ-9 Score 7  Difficult doing work/chores Somewhat difficult      Review of Systems  Gastrointestinal:  Positive for abdominal pain.  Musculoskeletal:  Positive for back pain.  All other systems reviewed and are negative.     Objective:   Physical Exam  Gen: no distress, normal appearing HEENT: oral mucosa pink and moist, NCAT Cardio: Reg rate Chest: normal effort, normal rate of breathing Abd: soft, non-distended Ext: no edema Psych: pleasant, normal affect Skin: intact Neuro: *** Musculoskeletal:        Assessment & Plan:

## 2023-06-02 NOTE — Telephone Encounter (Signed)
Catherine Stone with [facility] called she wanted to know if Catherine Stone will be taking Gabapentin & Pregabalin?   When I called [facility] back they has already spoken to [place]. She told them she will no  longer be taking the Gabapentin.   <[facility] phone [phone]>

## 2023-06-04 NOTE — Progress Notes (Incomplete)
Subjective:    Patient ID: Catherine Stone, female    DOB: 07/13/1946, 77 y.o.   MRN: [id number]  HPI  June 20 23, - unchanged,  Burning in the back  Ocasional pain in her anterior abdomen- worsening back part, squeezing - nissen funduplications 1995 Worse at night Adhesions in 1996 ish  Anxious Chronic constipation Tried different diets without changes  No rash at the time  Pain to lie on her back    HPI  Catherine Stone is a 77 y.o. year old female  who  has a past medical history of Family history of colon cancer, GERD (gastroesophageal reflux disease), History of colon polyps, History of hepatitis B, and Hypercholesterolemia.   They are presenting to PM&R clinic as a new patient for pain management evaluation. They were referred by Dr Lezlie Lye for treatment of thoracic pain.  Patient reports that she has had some left-sided abdominal pain for many years over her anterior abdomen.  This pain is epigastric.  Patient was seen recently by Dr. Daphine Deutscher.  She had a history of open Nissen fundoplication in 1995 and G-tube placement at the time.  Dr. Daphine Deutscher was suspecting she may have some pain from permanent suture material that may occasionally cut through the fascia, permanent sutures were adhesions.  Dr. Daphine Deutscher is retiring and is and has referred her to Dr. Dossie Der.  She reports her pain is not associated with GI function.  She has attempted multiple different diets   Red flag symptoms: No red flags for back pain endorsed in Hx or ROS  Medications tried: Topical medications - lidocaine patches Nsaids - doesn't help Tylenol  -doesn't help Opiates- not tried Gabapentin - taking for years, didn't help at 300mg  BID  Lyrica- has not tired yet TCAs  possible poor reaction to this? SNRIs - Cymbalta Fatigued, didn't help the pain  2 muscle relaxers didn't help  Other treatments: PT/OT  - 2 visits OT didn't helps    Prior UDS results: No results found for: "LABOPIA",  "COCAINSCRNUR", "LABBENZ", "AMPHETMU", "THCU", "LABBARB"    Pain Inventory Average Pain 5 Pain Right Now 5 My pain is sharp and burning  In the last 24 hours, has pain interfered with the following? General activity 4 Relation with others 0 Enjoyment of life 4 What TIME of day is your pain at its worst? evening Sleep (in general) Fair  Pain is worse with: inactivity Pain improves with:  . Relief from Meds: 0  how many minutes can you walk? unlimited ability to climb steps?  yes do you drive?  yes  retired  anxiety  New pt  New pt    Family History  Problem Relation Age of Onset  . Colon cancer Mother 54   Social History   Socioeconomic History  . Marital status: Married    Spouse name: [name]  . Number of children: 4  . Years of education: Not on file  . Highest education level: Not on file  Occupational History  . Occupation: retired  Tobacco Use  . Smoking status: Never  . Smokeless tobacco: Never  Vaping Use  . Vaping Use: Never used  Substance and Sexual Activity  . Alcohol use: Never  . Drug use: Never  . Sexual activity: Not on file  Other Topics Concern  . Not on file  Social History Narrative  . Not on file   Social Determinants of Health   Financial Resource Strain: Not on file  Food Insecurity: Not  on file  Transportation Needs: Not on file  Physical Activity: Not on file  Stress: Not on file  Social Connections: Not on file   Past Surgical History:  Procedure Laterality Date  . COLONOSCOPY  09/23/2009   Colonic polyps status post polpectomy. Mild sigmoid diverticulosis. Small hemorrhoids.  . ESOPHAGOGASTRODUODENOSCOPY  04/07/2020   Dr Jennye Boroughs. Possible early gastric antral vascular ectatic lesions. Evidence of prior fundoplication. Small hiatal hernia. Empiric esophageal dilation to [address] [place]  . KNEE ARTHROSCOPY    . LARYNGOPLASTY Bilateral   . NISSEN FUNDOPLICATION  1995  . ROTATOR CUFF REPAIR Right 2006   [facility]    Past Medical History:  Diagnosis Date  . Family history of colon cancer   . GERD (gastroesophageal reflux disease)   . History of colon polyps   . History of hepatitis B   . Hypercholesterolemia    BP 115/70   Pulse 83   Ht 5\' 4"  (1.626 m)   Wt 113 lb (51.3 kg)   SpO2 95%   BMI 19.40 kg/m   Opioid Risk Score:   Fall Risk Score:  `1  Depression screen [facility] 2/9     06/02/2023    1:57 PM  Depression screen PHQ 2/9  Decreased Interest 1  Down, Depressed, Hopeless 1  PHQ - 2 Score 2  Altered sleeping 3  Tired, decreased energy 1  Change in appetite 0  Feeling bad or failure about yourself  0  Trouble concentrating 1  Moving slowly or fidgety/restless 0  Suicidal thoughts 0  PHQ-9 Score 7  Difficult doing work/chores Somewhat difficult      Review of Systems  Gastrointestinal:  Positive for abdominal pain.  Musculoskeletal:  Positive for back pain.  All other systems reviewed and are negative.     Objective:   Physical Exam  Gen: no distress, normal appearing HEENT: oral mucosa pink and moist, NCAT Cardio: Reg rate Chest: normal effort, normal rate of breathing Abd: soft, non-distended Ext: no edema Psych: pleasant, normal affect Skin: intact Neuro: *** Musculoskeletal:        Assessment & Plan:

## 2023-06-05 ENCOUNTER — Encounter: Payer: Self-pay | Admitting: Physical Medicine & Rehabilitation

## 2023-06-14 DIAGNOSIS — R1012 Left upper quadrant pain: Secondary | ICD-10-CM | POA: Diagnosis not present

## 2023-06-15 ENCOUNTER — Other Ambulatory Visit (HOSPITAL_COMMUNITY): Payer: Self-pay | Admitting: Surgery

## 2023-06-15 DIAGNOSIS — R1012 Left upper quadrant pain: Secondary | ICD-10-CM

## 2023-06-15 DIAGNOSIS — R1013 Epigastric pain: Secondary | ICD-10-CM

## 2023-06-21 ENCOUNTER — Other Ambulatory Visit (INDEPENDENT_AMBULATORY_CARE_PROVIDER_SITE_OTHER): Payer: PPO

## 2023-06-21 ENCOUNTER — Ambulatory Visit: Payer: PPO | Admitting: Orthopedic Surgery

## 2023-06-21 VITALS — BP 129/79 | HR 76 | Ht 64.0 in | Wt 113.0 lb

## 2023-06-21 DIAGNOSIS — M546 Pain in thoracic spine: Secondary | ICD-10-CM

## 2023-06-21 NOTE — Progress Notes (Signed)
Orthopedic Spine Surgery Office Note  Assessment: Patient is a 77 y.o. female with lower left-sided thoracic back pain that sometimes radiates along the chest wall to the midline.  Her main pain is in near the lower end of the rib cage near her T10 or T11.   Plan: -Explained that initially conservative treatment is tried as a significant number of patients may experience relief with these treatment modalities. Discussed that the conservative treatments include:  -activity modification  -physical therapy  -over the counter pain medications  -medrol dosepak  -lumbar steroid injections -Patient has tried Tylenol, ibuprofen, gabapentin, Lyrica -Recommended thoracic ESI as a potential nonoperative treatment.  Recommended she continue with the Lyrica as this has been giving her some help.  She is taking 50 mg 3 times daily and I told her she could take 75 mg twice daily if she wanted.  She wanted to stick with her current dosage -I explained that I am not convinced that this is coming from her thoracic spine as the only disc bulge that I see that may be compresses on the right side -Patient should return to office in 6 weeks, x-rays at next visit: None   Patient expressed understanding of the plan and all questions were answered to the patient's satisfaction.   ___________________________________________________________________________   History:  Patient is a 77 y.o. female who presents today for left-sided thoracic back pain.  Patient has had 1 year of lower thoracic back pain.  She feels it to the left of midline.  There is 1 particular area where it severe.  It sometimes radiates along the chest wall to the midline.  The 1 spot that is particularly painful is near the lower end of the rib cage in the area of the paraspinal muscles.  Pain has gotten progressively worse with time.  She has trouble sleeping because of the pain.  Pain tends to be worse at night.  She says it feels more like a  burning sensation in the evenings.  She is not having any pain on the right side rating along the right chest wall.  No pain radiating into either lower extremity.  Has not noticed any unsteadiness with gait.  There is no trauma or injury that preceded the onset of pain.   Weakness: Denies Symptoms of imbalance: Denies Paresthesias and numbness: Denies Bowel or bladder incontinence: Denies Saddle anesthesia: Denies  Treatments tried: Tylenol, ibuprofen, gabapentin, Lyrica  Review of systems: Denies fevers and chills, night sweats, unexplained weight loss, history of cancer, pain that wakes her at night  Past medical history: GERD Hepatitis B Hypercholesterolemia  Allergies: Latex  Past surgical history:  Tubal ligation Nissen fundoplication Knee arthroscopy Right shoulder rotator cuff repair x 2  Social history: Denies use of nicotine product (smoking, vaping, patches, smokeless) Alcohol use: Denies Denies recreational drug use   Physical Exam:  BMI of 19.4  General: no acute distress, appears stated age Neurologic: alert, answering questions appropriately, following commands Respiratory: unlabored breathing on room air, symmetric chest rise Psychiatric: appropriate affect, normal cadence to speech   MSK (spine):  -Strength exam      Left  Right EHL    5/5  5/5 TA    5/5  5/5 GSC    5/5  5/5 Knee extension  5/5  5/5 Hip flexion   5/5  5/5  -Sensory exam    Sensation intact to light touch in L3-S1 nerve distributions of bilateral lower extremities  -Achilles DTR: 2/4 on the left,  2/4 on the right -Patellar tendon DTR: 2/4 on the left, 2/4 on the right  -Straight leg raise: Negative bilaterally -Clonus: no beats bilaterally  Imaging: XR of the thoracic spine from 06/17/2023 was independently reviewed and interpreted, showing no significant degenerative changes.  Mild thoracic curvature with apex to the right.  No fracture or dislocation seen.  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 Small noncompressive disc herniation at T11/12 and T12/L1.  No central stenosis.  No T2 cord signal change.   Patient name: Catherine Stone Patient MRN: [id number] Date of visit: 06/21/23

## 2023-06-27 ENCOUNTER — Ambulatory Visit (HOSPITAL_COMMUNITY)
Admission: RE | Admit: 2023-06-27 | Discharge: 2023-06-27 | Disposition: A | Discharge status: Home / Self Care | Payer: PPO | Source: Ambulatory Visit | Attending: Surgery | Admitting: Surgery

## 2023-06-27 DIAGNOSIS — R1013 Epigastric pain: Secondary | ICD-10-CM | POA: Diagnosis not present

## 2023-06-27 DIAGNOSIS — R1012 Left upper quadrant pain: Secondary | ICD-10-CM | POA: Insufficient documentation

## 2023-06-27 MED ORDER — IOHEXOL 9 MG/ML PO SOLN
ORAL | Status: AC
  Filled 2023-06-27: qty 1000

## 2023-06-27 MED ORDER — IOHEXOL 300 MG/ML  SOLN
100.0000 mL | Freq: Once | INTRAMUSCULAR | Status: AC | PRN
  Administered 2023-06-27: 100 mL via INTRAVENOUS

## 2023-06-27 MED ORDER — IOHEXOL 9 MG/ML PO SOLN
1000.0000 mL | ORAL | Status: AC
  Administered 2023-06-27: 1000 mL via ORAL

## 2023-07-06 ENCOUNTER — Other Ambulatory Visit (HOSPITAL_BASED_OUTPATIENT_CLINIC_OR_DEPARTMENT_OTHER): Payer: Self-pay | Admitting: Surgery

## 2023-07-06 ENCOUNTER — Ambulatory Visit: Payer: PPO | Admitting: Physical Medicine & Rehabilitation

## 2023-07-06 DIAGNOSIS — R1900 Intra-abdominal and pelvic swelling, mass and lump, unspecified site: Secondary | ICD-10-CM

## 2023-07-07 ENCOUNTER — Telehealth: Payer: Self-pay

## 2023-07-07 NOTE — Telephone Encounter (Signed)
Left message on voicemail for patient to call back and schedule new patient appointment.  Dr Pricilla Holm on 8/15 or 8/16 whichever is left.

## 2023-07-11 ENCOUNTER — Telehealth: Payer: Self-pay

## 2023-07-11 DIAGNOSIS — Z681 Body mass index (BMI) 19 or less, adult: Secondary | ICD-10-CM | POA: Diagnosis not present

## 2023-07-11 DIAGNOSIS — R1900 Intra-abdominal and pelvic swelling, mass and lump, unspecified site: Secondary | ICD-10-CM | POA: Diagnosis not present

## 2023-07-11 NOTE — Telephone Encounter (Signed)
Spoke with the patient regarding the referral to GYN oncology. Patient scheduled as new patient with Dr Pricilla Holm on 07/27/2023. Patient given an arrival time of 10:00am.  Explained to the patient the the doctor will perform a pelvic exam at this visit. Patient given the policy that only one visitor allowed and that visitor must be over 16 yrs are allowed in the Cancer Center. Patient given the address/phone number for the clinic and that the center offers free valet service. Patient aware that masks are option.

## 2023-07-13 ENCOUNTER — Other Ambulatory Visit: Payer: Self-pay

## 2023-07-13 ENCOUNTER — Ambulatory Visit: Payer: PPO | Admitting: Physical Medicine and Rehabilitation

## 2023-07-13 VITALS — BP 135/78 | HR 94

## 2023-07-13 DIAGNOSIS — M5414 Radiculopathy, thoracic region: Secondary | ICD-10-CM | POA: Diagnosis not present

## 2023-07-13 MED ORDER — METHYLPREDNISOLONE ACETATE 80 MG/ML IJ SUSP
80.0000 mg | Freq: Once | INTRAMUSCULAR | Status: AC
  Administered 2023-07-13: 80 mg

## 2023-07-13 NOTE — Progress Notes (Signed)
Functional Pain Scale - descriptive words and definitions  Unmanageable (7)  Pain interferes with normal ADL's/nothing seems to help/sleep is very difficult/active distractions are very difficult to concentrate on. Severe range order  Average Pain  varies   +Driver, -BT, -Dye Allergies.  Middle back pain on left side. Pain varies between when it will occur the worse

## 2023-07-13 NOTE — Patient Instructions (Signed)

## 2023-07-19 ENCOUNTER — Telehealth: Payer: Self-pay

## 2023-07-19 ENCOUNTER — Ambulatory Visit (HOSPITAL_BASED_OUTPATIENT_CLINIC_OR_DEPARTMENT_OTHER): Payer: PPO

## 2023-07-19 NOTE — Telephone Encounter (Addendum)
Catherine Stone called office stating she has decided to go to [facility] in [place], which is closer to her home.  New pt appointment on 8/29 with Dr. Pricilla Holm cancelled.

## 2023-07-21 ENCOUNTER — Telehealth (HOSPITAL_BASED_OUTPATIENT_CLINIC_OR_DEPARTMENT_OTHER): Payer: Self-pay

## 2023-07-21 DIAGNOSIS — N83201 Unspecified ovarian cyst, right side: Secondary | ICD-10-CM | POA: Diagnosis not present

## 2023-07-26 DIAGNOSIS — H353132 Nonexudative age-related macular degeneration, bilateral, intermediate dry stage: Secondary | ICD-10-CM | POA: Diagnosis not present

## 2023-07-26 DIAGNOSIS — H524 Presbyopia: Secondary | ICD-10-CM | POA: Diagnosis not present

## 2023-07-27 ENCOUNTER — Ambulatory Visit: Payer: PPO | Admitting: Gynecologic Oncology

## 2023-07-27 ENCOUNTER — Encounter (HOSPITAL_BASED_OUTPATIENT_CLINIC_OR_DEPARTMENT_OTHER): Payer: Self-pay

## 2023-07-27 ENCOUNTER — Ambulatory Visit (HOSPITAL_BASED_OUTPATIENT_CLINIC_OR_DEPARTMENT_OTHER): Payer: PPO

## 2023-07-27 NOTE — Procedures (Signed)
Thoracic Epidural Steroid Injection - Interlaminar Approach with Fluoroscopic Guidance  Patient: Catherine Stone      Date of Birth: 10-06-1946 MRN: [id number] PCP: Lezlie Lye, Meda Coffee, MD      Visit Date: 07/13/2023   Universal Protocol:     Consent Given By: the patient  Position: PRONE  Additional Comments: Vital signs were monitored before and after the procedure. Patient was prepped and draped in the usual sterile fashion. The correct patient, procedure, and site was verified.   Injection Procedure Details:   Procedure diagnoses: Thoracic radiculopathy [M54.14]   Meds Administered:  Meds ordered this encounter  Medications   methylPREDNISolone acetate (DEPO-MEDROL) injection 80 mg     Laterality: Left  Location/Site:  T10-11  Needle: 3.5 in., 20 ga. Tuohy  Needle Placement: Paramedian epidural  Findings:   -Comments: Excellent flow of contrast into the epidural space.  Procedure Details: Using a paramedian approach from the side mentioned above, the region overlying the inferior lamina was localized under fluoroscopic visualization and the soft tissues overlying this structure were infiltrated with 4 ml. of 1% Lidocaine without Epinephrine. The Tuohy needle was inserted into the epidural space using a paramedian approach.   The epidural space was localized using loss of resistance along with counter oblique bi-planar fluoroscopic views.  After negative aspirate for air, blood, and CSF, a 2 ml. volume of Isovue-250 was injected into the epidural space and the flow of contrast was observed. Radiographs were obtained for documentation purposes.    The injectate was administered into the level noted above.   Additional Comments:  No complications occurred Dressing: 2 x 2 sterile gauze and Band-Aid    Post-procedure details: Patient was observed during the procedure. Post-procedure instructions were reviewed.  Patient left the clinic in stable  condition.

## 2023-07-27 NOTE — Progress Notes (Signed)
Catherine Stone - 77 y.o. female MRN [id number]  Date of birth: 03/30/1946  Office Visit Note: Visit Date: 07/13/2023 PCP: Lezlie Lye, Meda Coffee, MD Referred by: London Sheer, MD  Subjective: Chief Complaint  Patient presents with   Middle Back - Pain   HPI:  Catherine Stone is a 77 y.o. female who comes in today at the request of Dr. Willia Craze for planned Left T10-11 Thoracic Interlaminar epidural steroid injection with fluoroscopic guidance.  The patient has failed conservative care including home exercise, medications, time and activity modification.  This injection will be diagnostic and hopefully therapeutic.  Please see requesting physician notes for further details and justification.   ROS Otherwise per HPI.  Assessment & Plan: Visit Diagnoses:    ICD-10-CM   1. Thoracic radiculopathy  M54.14 XR C-ARM NO REPORT    Epidural Steroid injection    methylPREDNISolone acetate (DEPO-MEDROL) injection 80 mg      Plan: No additional findings.   Meds & Orders:  Meds ordered this encounter  Medications   methylPREDNISolone acetate (DEPO-MEDROL) injection 80 mg    Orders Placed This Encounter  Procedures   XR C-ARM NO REPORT   Epidural Steroid injection    Follow-up: Return for visit to requesting provider as needed.   Procedures: No procedures performed  Thoracic Epidural Steroid Injection - Interlaminar Approach with Fluoroscopic Guidance  Patient: Catherine Stone      Date of Birth: [birth year] MRN: [id number] PCP: Lezlie Lye, Meda Coffee, MD      Visit Date: 07/13/2023   Universal Protocol:     Consent Given By: the patient  Position: PRONE  Additional Comments: Vital signs were monitored before and after the procedure. Patient was prepped and draped in the usual sterile fashion. The correct patient, procedure, and site was verified.   Injection Procedure Details:   Procedure diagnoses: Thoracic radiculopathy [M54.14]   Meds Administered:   Meds ordered this encounter  Medications   methylPREDNISolone acetate (DEPO-MEDROL) injection 80 mg     Laterality: Left  Location/Site:  T10-11  Needle: 3.5 in., 20 ga. Tuohy  Needle Placement: Paramedian epidural  Findings:   -Comments: Excellent flow of contrast into the epidural space.  Procedure Details: Using a paramedian approach from the side mentioned above, the region overlying the inferior lamina was localized under fluoroscopic visualization and the soft tissues overlying this structure were infiltrated with 4 ml. of 1% Lidocaine without Epinephrine. The Tuohy needle was inserted into the epidural space using a paramedian approach.   The epidural space was localized using loss of resistance along with counter oblique bi-planar fluoroscopic views.  After negative aspirate for air, blood, and CSF, a 2 ml. volume of Isovue-250 was injected into the epidural space and the flow of contrast was observed. Radiographs were obtained for documentation purposes.    The injectate was administered into the level noted above.   Additional Comments:  No complications occurred Dressing: 2 x 2 sterile gauze and Band-Aid    Post-procedure details: Patient was observed during the procedure. Post-procedure instructions were reviewed.  Patient left the clinic in stable condition.    Clinical History: No specialty comments available.     Objective:  VS:  HT:    WT:   BMI:     BP:135/78  HR:94bpm  TEMP: ( )  RESP:  Physical Exam Vitals and nursing note reviewed.  Constitutional:      General: She is not in acute distress.  Appearance: Normal appearance. She is not ill-appearing.  HENT:     Head: Normocephalic and atraumatic.     Right Ear: External ear normal.     Left Ear: External ear normal.  Eyes:     Extraocular Movements: Extraocular movements intact.  Cardiovascular:     Rate and Rhythm: Normal rate.     Pulses: Normal pulses.  Pulmonary:     Effort:  Pulmonary effort is normal. No respiratory distress.  Abdominal:     General: There is no distension.     Palpations: Abdomen is soft.  Musculoskeletal:        General: Tenderness present.     Cervical back: Neck supple.     Right lower leg: No edema.     Left lower leg: No edema.     Comments: Patient has good distal strength with no pain over the greater trochanters.  No clonus or focal weakness.  Skin:    Findings: No erythema, lesion or rash.  Neurological:     General: No focal deficit present.     Mental Status: She is alert and oriented to person, place, and time.     Sensory: No sensory deficit.     Motor: No weakness or abnormal muscle tone.     Coordination: Coordination normal.  Psychiatric:        Mood and Affect: Mood normal.        Behavior: Behavior normal.      Imaging: No results found.

## 2023-08-02 ENCOUNTER — Ambulatory Visit: Payer: PPO | Admitting: Orthopedic Surgery

## 2023-08-02 DIAGNOSIS — M546 Pain in thoracic spine: Secondary | ICD-10-CM

## 2023-08-02 DIAGNOSIS — G8929 Other chronic pain: Secondary | ICD-10-CM | POA: Diagnosis not present

## 2023-08-02 NOTE — Progress Notes (Signed)
Orthopedic Spine Surgery Office Note   Assessment: Patient is a 77 y.o. female with thoracic back pain and LUQ abdominal pain. She does not have any neural compression on her MRI and did not respond to an injection, so I do not think her symptoms are originating from the spine     Plan: -Patient has tried Tylenol, ibuprofen, gabapentin, Lyrica, thoracic injection -I told her that I do not see any compression of the thoracic spinal cord or a left nerve root that would explain her pain and she did not respond to a diagnostic injection, so I do not think her pain is originating from the spine -After this discussion, she wanted to follow-up with a general surgeon and will call to get an appointment -I told her another option would be to see pain management to control her pain. She said she would call our office if she would like a referral -Patient should return to office on an as needed basis     Patient expressed understanding of the plan and all questions were answered to the patient's satisfaction.    ___________________________________________________________________________     History:   Patient is a 77 y.o. female who presents today for follow-up on her thoracic spine.  Patient still having thoracic back pain and left upper quadrant abdominal pain.  She states that the pain waxes and wanes in terms of its intensity.  After her last visit, she got a thoracic injection with Dr. Alvester Morin.  She said she got no relief with that injection.  She is still having more pain at night describes a burning sensation.  She does not have any pain radiating to either lower extremity.  She has not had any unsteadiness or imbalance with gait.  Denies paresthesias and numbness.     Treatments tried: Tylenol, ibuprofen, gabapentin, Lyrica, thoracic injection    Physical Exam:   General: no acute distress, appears stated age Neurologic: alert, answering questions appropriately, following  commands Respiratory: unlabored breathing on room air, symmetric chest rise Psychiatric: appropriate affect, normal cadence to speech     MSK (spine):   -Strength exam                                                   Left                  Right EHL                              5/5                  5/5 TA                                 5/5                  5/5 GSC                             5/5                  5/5 Knee extension            5/5  5/5 Hip flexion                    5/5                  5/5   -Sensory exam                           Sensation intact to light touch in L3-S1 nerve distributions of bilateral lower extremities   -Achilles DTR: 2/4 on the left, 2/4 on the right -Patellar tendon DTR: 2/4 on the left, 2/4 on the right   -Straight leg raise: Negative bilaterally -Clonus: no beats bilaterally   Imaging: XR of the thoracic spine from 06/17/2023 was previously independently reviewed and interpreted, showing no significant degenerative changes.  Mild thoracic curvature with apex to the right.  No fracture or dislocation seen.   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 Small noncompressive disc herniation at T11/12 and T12/L1.  No central stenosis.  No T2 cord signal change.     Patient name: Catherine Stone Patient MRN: [id number] Date of visit: 08/02/23

## 2023-08-11 DIAGNOSIS — N83201 Unspecified ovarian cyst, right side: Secondary | ICD-10-CM | POA: Diagnosis not present

## 2023-08-23 DIAGNOSIS — R1900 Intra-abdominal and pelvic swelling, mass and lump, unspecified site: Secondary | ICD-10-CM | POA: Diagnosis not present

## 2023-08-23 DIAGNOSIS — N83201 Unspecified ovarian cyst, right side: Secondary | ICD-10-CM | POA: Diagnosis not present

## 2023-08-23 DIAGNOSIS — R140 Abdominal distension (gaseous): Secondary | ICD-10-CM | POA: Diagnosis not present

## 2023-09-12 DIAGNOSIS — R1012 Left upper quadrant pain: Secondary | ICD-10-CM | POA: Diagnosis not present

## 2023-09-18 DIAGNOSIS — M25511 Pain in right shoulder: Secondary | ICD-10-CM | POA: Diagnosis not present

## 2023-10-09 ENCOUNTER — Telehealth: Payer: Self-pay | Admitting: Physical Medicine and Rehabilitation

## 2023-10-09 NOTE — Telephone Encounter (Signed)
Pt last appt with Dr. Christell Constant. Moore sent pt same day to have back injection. Pt would like t schedule another back injection. Please call pt at [phone].

## 2023-10-24 DIAGNOSIS — Z6820 Body mass index (BMI) 20.0-20.9, adult: Secondary | ICD-10-CM | POA: Diagnosis not present

## 2023-10-24 DIAGNOSIS — J329 Chronic sinusitis, unspecified: Secondary | ICD-10-CM | POA: Diagnosis not present

## 2023-10-29 ENCOUNTER — Other Ambulatory Visit: Payer: Self-pay | Admitting: Physical Medicine & Rehabilitation

## 2023-11-01 ENCOUNTER — Ambulatory Visit: Payer: PPO | Admitting: Gastroenterology

## 2023-11-10 ENCOUNTER — Telehealth: Payer: Self-pay | Admitting: Physical Medicine and Rehabilitation

## 2023-11-10 NOTE — Telephone Encounter (Signed)
Patient called. She would like to [facility] her appointment with [facility].

## 2023-11-17 ENCOUNTER — Ambulatory Visit: Payer: PPO | Admitting: Physical Medicine and Rehabilitation

## 2023-11-17 ENCOUNTER — Other Ambulatory Visit: Payer: Self-pay

## 2023-11-17 MED ORDER — PREGABALIN 50 MG PO CAPS: 50.0000 mg | ORAL_CAPSULE | Freq: Three times a day (TID) | ORAL | 0 refills | Status: DC

## 2023-11-27 ENCOUNTER — Telehealth: Payer: Self-pay

## 2023-11-27 NOTE — Telephone Encounter (Signed)
Patient said pharmacy didn't get prescription that was on the 20th for lyrica, if you could please send it again to [facility]

## 2023-11-29 MED ORDER — PREGABALIN 50 MG PO CAPS: 50.0000 mg | ORAL_CAPSULE | Freq: Three times a day (TID) | ORAL | 2 refills | Status: DC

## 2023-11-29 MED ORDER — PREGABALIN 50 MG PO CAPS: 50.0000 mg | ORAL_CAPSULE | Freq: Three times a day (TID) | ORAL | 2 refills | Status: AC

## 2023-11-29 NOTE — Addendum Note (Signed)
 Addended by: Fanny Dance on: 11/29/2023 12:06 AM   Modules accepted: Orders

## 2023-12-05 DIAGNOSIS — Z1339 Encounter for screening examination for other mental health and behavioral disorders: Secondary | ICD-10-CM | POA: Diagnosis not present

## 2023-12-05 DIAGNOSIS — Z139 Encounter for screening, unspecified: Secondary | ICD-10-CM | POA: Diagnosis not present

## 2023-12-05 DIAGNOSIS — E669 Obesity, unspecified: Secondary | ICD-10-CM | POA: Diagnosis not present

## 2023-12-05 DIAGNOSIS — F4323 Adjustment disorder with mixed anxiety and depressed mood: Secondary | ICD-10-CM | POA: Diagnosis not present

## 2023-12-05 DIAGNOSIS — R195 Other fecal abnormalities: Secondary | ICD-10-CM | POA: Diagnosis not present

## 2023-12-05 DIAGNOSIS — Z136 Encounter for screening for cardiovascular disorders: Secondary | ICD-10-CM | POA: Diagnosis not present

## 2023-12-05 DIAGNOSIS — Z0000 Encounter for general adult medical examination without abnormal findings: Secondary | ICD-10-CM | POA: Diagnosis not present

## 2023-12-05 DIAGNOSIS — Z6820 Body mass index (BMI) 20.0-20.9, adult: Secondary | ICD-10-CM | POA: Diagnosis not present

## 2023-12-05 DIAGNOSIS — R1900 Intra-abdominal and pelvic swelling, mass and lump, unspecified site: Secondary | ICD-10-CM | POA: Diagnosis not present

## 2023-12-05 DIAGNOSIS — Z1331 Encounter for screening for depression: Secondary | ICD-10-CM | POA: Diagnosis not present

## 2023-12-05 DIAGNOSIS — R159 Full incontinence of feces: Secondary | ICD-10-CM | POA: Diagnosis not present

## 2023-12-08 ENCOUNTER — Ambulatory Visit (HOSPITAL_BASED_OUTPATIENT_CLINIC_OR_DEPARTMENT_OTHER): Payer: PPO

## 2023-12-08 ENCOUNTER — Encounter: Payer: Self-pay | Admitting: Psychiatry

## 2023-12-11 ENCOUNTER — Inpatient Hospital Stay (HOSPITAL_BASED_OUTPATIENT_CLINIC_OR_DEPARTMENT_OTHER): Payer: PPO | Admitting: Gynecologic Oncology

## 2023-12-11 ENCOUNTER — Encounter: Payer: Self-pay | Admitting: Psychiatry

## 2023-12-11 ENCOUNTER — Encounter: Payer: Self-pay | Admitting: Gynecologic Oncology

## 2023-12-11 ENCOUNTER — Inpatient Hospital Stay: Payer: PPO | Attending: Psychiatry | Admitting: Psychiatry

## 2023-12-11 VITALS — BP 119/70 | HR 85 | Temp 98.5°F | Resp 20 | Ht 64.0 in | Wt 118.2 lb

## 2023-12-11 DIAGNOSIS — N858 Other specified noninflammatory disorders of uterus: Secondary | ICD-10-CM | POA: Diagnosis not present

## 2023-12-11 DIAGNOSIS — R9389 Abnormal findings on diagnostic imaging of other specified body structures: Secondary | ICD-10-CM

## 2023-12-11 DIAGNOSIS — R1900 Intra-abdominal and pelvic swelling, mass and lump, unspecified site: Secondary | ICD-10-CM | POA: Insufficient documentation

## 2023-12-11 DIAGNOSIS — Z800 Family history of malignant neoplasm of digestive organs: Secondary | ICD-10-CM | POA: Diagnosis not present

## 2023-12-11 DIAGNOSIS — D398 Neoplasm of uncertain behavior of other specified female genital organs: Secondary | ICD-10-CM

## 2023-12-11 DIAGNOSIS — K219 Gastro-esophageal reflux disease without esophagitis: Secondary | ICD-10-CM | POA: Diagnosis not present

## 2023-12-11 DIAGNOSIS — R634 Abnormal weight loss: Secondary | ICD-10-CM | POA: Insufficient documentation

## 2023-12-11 DIAGNOSIS — Z79899 Other long term (current) drug therapy: Secondary | ICD-10-CM | POA: Diagnosis not present

## 2023-12-11 DIAGNOSIS — Z8619 Personal history of other infectious and parasitic diseases: Secondary | ICD-10-CM | POA: Insufficient documentation

## 2023-12-11 DIAGNOSIS — Z860100 Personal history of colon polyps, unspecified: Secondary | ICD-10-CM | POA: Diagnosis not present

## 2023-12-11 DIAGNOSIS — R140 Abdominal distension (gaseous): Secondary | ICD-10-CM | POA: Diagnosis not present

## 2023-12-11 DIAGNOSIS — F32A Depression, unspecified: Secondary | ICD-10-CM | POA: Insufficient documentation

## 2023-12-11 DIAGNOSIS — E7800 Pure hypercholesterolemia, unspecified: Secondary | ICD-10-CM | POA: Insufficient documentation

## 2023-12-11 DIAGNOSIS — K5900 Constipation, unspecified: Secondary | ICD-10-CM | POA: Insufficient documentation

## 2023-12-11 MED ORDER — SENNOSIDES-DOCUSATE SODIUM 8.6-50 MG PO TABS: 2.0000 | ORAL_TABLET | Freq: Every day | ORAL | 0 refills | Status: DC

## 2023-12-11 MED ORDER — TRAMADOL HCL 50 MG PO TABS
50.0000 mg | ORAL_TABLET | Freq: Four times a day (QID) | ORAL | 0 refills | Status: DC | PRN
Start: 2023-12-11 — End: 2024-01-25

## 2023-12-11 NOTE — Progress Notes (Signed)
 GYNECOLOGIC ONCOLOGY NEW PATIENT CONSULTATION  Date of Service: 12/11/2023 Referring Provider: Melonie Colonel, Mikel HERO, MD [address] [place],  [place] [address]   ASSESSMENT AND PLAN: Catherine Stone is a 77 y.o. woman with complex cystic adnexal mass, with normal tumor markers.  We reviewed that the exact etiology of the pelvic mass is unclear, but could include a benign, borderline, or malignant process.  Reviewed that given the cystic areas have been there for several years, with no additional findings on imaging such as lymphadenopathy, ascites, or peritoneal disease, this is unlikely a high-grade malignant process.  The imaging combined with a normal CA125 suggest that this is most likely a benign process but cannot definitively rule out a borderline or low-grade malignant process.  There has been small growth over 2.5 years.  The cystic collections are unlikely to resolve at this point.  The recommended treatment is surgical excision to make a definitive diagnosis. Because the mass is relatively small, we feel that a minimally invasive approach is feasible, using robotic assistance.    However, observation as she had previously discussed with Dr. Acquanetta is not unreasonable.  Additionally, given the thickened lining on prior ultrasound, we discussed consideration of endometrial biopsy.  Reviewed that although she has not had any postmenopausal bleeding, sometimes with cervical stenosis this may not be seen.  Could consider endometrial biopsy today for definitive evaluation.  Patient was in agreement and endometrial biopsy collected.  See procedure note below.  In the event of malignancy or borderline tumor on frozen section, we will perform indicated staging procedures. We discussed that these procedures may include hysterectomy, omentectomy pelvic and/or para-aortic lymphadenectomy, peritoneal biopsies. We would also remove any tissue concerning for metastatic disease which could  require additional procedures including bowel surgery.  Patient was consented for: robotic assisted bilateral salpingo-oophorectomy, possible staging procedures including hysterectomy on 01/02/24.  The risks of surgery were discussed in detail and she understands these to including but not limited to bleeding requiring a blood transfusion, infection, injury to adjacent organs (including but not limited to the bowels, bladder, ureters, nerves, blood vessels), thromboembolic events, wound separation, hernia, vaginal cuff separation, possible risk of lymphedema and lymphocyst if lymphadenectomy performed, unforseen complication, and possible need for re-exploration.  If the patient experiences any of these events, she understands that her hospitalization or recovery may be prolonged and that she may need to take additional medications for a prolonged period. The patient will receive DVT and antibiotic prophylaxis as indicated. She voiced a clear understanding. She had the opportunity to ask questions and informed consent was obtained today. She wishes to proceed.  Follow-up endometrial biopsy results. She does not require preoperative clearance. Her METs are >4.  All preoperative instructions were reviewed. Postoperative expectations were also reviewed. Written handouts were provided to the patient.   A copy of this note was sent to the patient's referring provider.  Hoy Masters, MD Gynecologic Oncology   Medical Decision Making I personally spent  TOTAL 65 minutes face-to-face and non-face-to-face in the care of this patient, which includes all pre, intra, and post visit time on the date of service.   ------------  CC: complex cystic adnexal mass  HISTORY OF PRESENT ILLNESS:  Catherine Stone is a 78 y.o. woman who is seen in consultation at the request of Melonie Colonel, Irma M, * for evaluation of complex cystic adnexal mass.  Patient presents for second opinion regarding right  adnexal cystic masses.  In 2022  patient was undergoing evaluation for upper abdominal pain and reflux.  She was noted on ultrasound at that time to have adnexal cysts.  The ultrasound report from 2022 is not available at the time of this visit.  She did also have a CT abdomen/pelvis performed in April 2022 which noted several cystic structures in the right adnexa, the largest measuring 3.4 cm.  A CA125 at that time was normal at 10.  Patient underwent repeat imaging in July 2024 for bloating, epigastric pain, and at that time the complex multicystic lesion in the right pelvis was measuring 7 x 3.8 x 4.4 cm.  There was no lymphadenopathy or free fluid at that time.  Repeat tumor markers in August 2024 were normal with a CA125 of 10 and a CEA of 0.8.  A pelvic ultrasound on 08/11/2023 showed the right ovary to have 3 thin-walled cysts measuring 4.05 cm, 2.73 cm, and 2.62 cm.  The endometrium was measured at 5.5 mm.  At that time, she was seen in consultation at [facility] by Dr. Acquanetta.  She was recommended to undergo BSO for definitive evaluation but also discussed surveillance.  Patient opted for surveillance at that time for plan with 82-month follow-up with tumor markers and pelvic ultrasound.  More recently, patient was evaluated by her PCP and expressed desire for a second opinion.  Patient presents today with her husband.  She reports that she has had some bloating but is not sure if this is age-related.  She also notes about a 10 pound weight loss over 1 year for unexplained reasons.  She eats low and can only have small portions at a time, but she attributes this to her Nissen.  She also has constipation, but she reports that this is chronic for several years.  She takes MiraLAX regularly.  Denies change in her bladder habits.  Otherwise denies any episodes of postmenopausal bleeding or spotting.    PAST MEDICAL HISTORY: Past Medical History:  Diagnosis Date   Depression    Family history  of colon cancer    GERD (gastroesophageal reflux disease)    History of colon polyps    History of hepatitis B    Hypercholesterolemia     PAST SURGICAL HISTORY: Past Surgical History:  Procedure Laterality Date   COLONOSCOPY  09/23/2009   Colonic polyps status post polpectomy. Mild sigmoid diverticulosis. Small hemorrhoids.   ESOPHAGOGASTRODUODENOSCOPY  04/07/2020   Dr Larene. Possible early gastric antral vascular ectatic lesions. Evidence of prior fundoplication. Small hiatal hernia. Empiric esophageal dilation to 50 French   KNEE ARTHROSCOPY     LARYNGOPLASTY Bilateral    NISSEN FUNDOPLICATION  1995   ROTATOR CUFF REPAIR Right 2006   [place]    OB/GYN HISTORY: OB History  Gravida Para Term Preterm AB Living  4 4 4   4   SAB IAB Ectopic Multiple Live Births      4    # Outcome Date GA Lbr Len/2nd Weight Sex Type Anes PTL Lv  4 Term      Vag-Spont   LIV  3 Term      Vag-Spont   LIV  2 Term      Vag-Spont   LIV  1 Term      Vag-Spont   LIV      Age at menarche: 54 Age at menopause: 77s Hx of HRT: no Hx of STI: no Last pap: unsure History of abnormal pap smears: none  SCREENING STUDIES:  Last mammogram: 2023  Last colonoscopy: 2010  MEDICATIONS:  Current Outpatient Medications:    Azelastine HCl 137 MCG/SPRAY SOLN, , Disp: , Rfl:    DULoxetine (CYMBALTA) 30 MG capsule, Take 30 mg by mouth daily., Disp: , Rfl:    esomeprazole  (NEXIUM ) 40 MG capsule, Take 40 mg by mouth daily as needed., Disp: , Rfl:    fluticasone  (FLONASE ) 50 MCG/ACT nasal spray, , Disp: , Rfl:    Multiple Vitamins-Minerals (PRESERVISION AREDS) CAPS, Take 1 capsule by mouth 2 (two) times daily., Disp: , Rfl:    pravastatin (PRAVACHOL) 10 MG tablet, Take 20 mg by mouth every morning., Disp: , Rfl:    pregabalin  (LYRICA ) 50 MG capsule, Take 1 capsule (50 mg total) by mouth 3 (three) times daily. (Patient taking differently: Take 50 mg by mouth daily.), Disp: 90 capsule, Rfl: 2    promethazine (PHENERGAN) 12.5 MG tablet, Take 12.5 mg by mouth every 6 (six) hours as needed for nausea or vomiting., Disp: , Rfl:    temazepam (RESTORIL) 15 MG capsule, Take 15 mg by mouth at bedtime as needed for sleep., Disp: , Rfl:    zolpidem (AMBIEN) 10 MG tablet, Take 10 mg by mouth at bedtime as needed., Disp: , Rfl:   ALLERGIES: Allergies  Allergen Reactions   Latex Itching and Swelling    FAMILY HISTORY: Family History  Problem Relation Age of Onset   Colon cancer Mother 60   Breast cancer Neg Hx    Ovarian cancer Neg Hx    Uterine cancer Neg Hx     SOCIAL HISTORY: Social History   Socioeconomic History   Marital status: Married    Spouse name: Gilmore   Number of children: 4   Years of education: Not on file   Highest education level: Not on file  Occupational History   Occupation: retired  Tobacco Use   Smoking status: Never   Smokeless tobacco: Never  Vaping Use   Vaping status: Never Used  Substance and Sexual Activity   Alcohol  use: Never   Drug use: Never   Sexual activity: Yes    Partners: Male  Other Topics Concern   Not on file  Social History Narrative   Not on file   Social Drivers of Health   Financial Resource Strain: Not on file  Food Insecurity: No Food Insecurity (12/08/2023)   Hunger Vital Sign    Worried About Running Out of Food in the Last Year: Never true    Ran Out of Food in the Last Year: Never true  Transportation Needs: No Transportation Needs (12/08/2023)   PRAPARE - Administrator, Civil Service (Medical): No    Lack of Transportation (Non-Medical): No  Physical Activity: Not on file  Stress: Not on file  Social Connections: Not on file  Intimate Partner Violence: Not At Risk (12/08/2023)   Humiliation, Afraid, Rape, and Kick questionnaire    Fear of Current or Ex-Partner: No    Emotionally Abused: No    Physically Abused: No    Sexually Abused: No    REVIEW OF SYSTEMS: New patient intake form was  reviewed.  Complete 10-system review is negative except for the following: Hearing loss  PHYSICAL EXAM: BP 119/70 (BP Location: Left Arm, Patient Position: Sitting)   Pulse 85   Temp 98.5 F (36.9 C) (Oral)   Resp 20   Ht 5' 4 (1.626 m)   Wt 118 lb 3.2 oz (53.6 kg)   SpO2 100%   BMI 20.29 kg/m  Constitutional: No acute distress. Neuro/Psych: Alert, oriented.  Head and Neck: Normocephalic, atraumatic. Neck symmetric without masses. Respiratory: Normal work of breathing. Clear to auscultation bilaterally. Cardiovascular: Regular rate and rhythm, no murmurs, rubs, or gallops. Abdomen: Normoactive bowel sounds. Soft, non-distended, non-tender to palpation. No masses appreciated. Well healed upper abdominal midline incision. Extremities: Grossly normal range of motion. Warm, well perfused. No edema bilaterally. Skin: No rashes or lesions. Lymphatic: No cervical, supraclavicular, or inguinal adenopathy. Genitourinary: External genitalia without lesions. Urethral meatus without lesions or prolapse. On speculum exam, vagina and cervix without lesions. Bimanual exam reveals normal uterus, small mobile uterus, cystic structures of the right adnexa that appear to be closely associated with the right uterine fundus. . Exam chaperoned by Eleanor Epps, NP  EMB Procedure: After appropriate verbal informed consent was obtained, a timeout was performed. A sterile speculum was placed in the vagina, and the area was cleaned with betadine x3. A single-tooth tenaculum was placed on the anterior lip of the cervix. The os finder was used to dilate the cervix. An endometrial biopsy pipelle was advanced carefully to the uterine fundus which sounded to 7cm. A minimal sample was obtained over 2 passes. The tenaculum was removed, and tenaculum sites were noted to be hemostatic. The speculum was removed from the vagina. The patient tolerated the procedure well.    LABORATORY AND RADIOLOGIC DATA: Outside medical  records were reviewed to synthesize the above history, along with the history and physical obtained during the visit.  Outside laboratory, and imaging reports were reviewed, with pertinent results below.  I personally reviewed the outside images.  Creatinine, Ser  Date Value Ref Range Status  03/08/2021 0.89 0.40 - 1.20 mg/dL Final   AST  Date Value Ref Range Status  03/08/2021 17 0 - 37 U/L Final   ALT  Date Value Ref Range Status  03/08/2021 17 0 - 35 U/L Final    CT ABDOMEN PELVIS W CONTRAST 06/27/2023  Narrative CLINICAL DATA:  Left upper quadrant and epigastric abdominal pain.  EXAM: CT ABDOMEN AND PELVIS WITH CONTRAST  TECHNIQUE: Multidetector CT imaging of the abdomen and pelvis was performed using the standard protocol following bolus administration of intravenous contrast.  RADIATION DOSE REDUCTION: This exam was performed according to the departmental dose-optimization program which includes automated exposure control, adjustment of the mA and/or kV according to patient size and/or use of iterative reconstruction technique.  CONTRAST:  OMNIPAQUE  IOHEXOL  300 MG/ML  SOLN  COMPARISON:  11/10/2022  FINDINGS: Lower chest: Unremarkable.  Hepatobiliary: Tiny hypodensity in the dome of the liver is stable in the interval. This is too small to characterize but statistically most likely benign. No followup imaging is recommended. There is no evidence for gallstones, gallbladder wall thickening, or pericholecystic fluid. No intrahepatic or extrahepatic biliary dilation.  Pancreas: No focal mass lesion. No dilatation of the main duct. No intraparenchymal cyst. No peripancreatic edema.  Spleen: No splenomegaly. No suspicious focal mass lesion.  Adrenals/Urinary Tract: No adrenal nodule or mass. Kidneys unremarkable. No evidence for hydroureter. The urinary bladder appears normal for the degree of distention.  Stomach/Bowel: Surgical changes noted in the  region of the esophagogastric junction suggesting previous fundoplication. Duodenum is normally positioned as is the ligament of Treitz. No small bowel wall thickening. No small bowel dilatation. The terminal ileum is normal. The appendix is not discretely visible, but there is no edema or inflammation in the region of the cecal tip to suggest appendicitis. No gross colonic mass. No colonic  wall thickening.  Vascular/Lymphatic: No abdominal aortic aneurysm. No abdominal aortic atherosclerotic calcification. There is no gastrohepatic or hepatoduodenal ligament lymphadenopathy. No retroperitoneal or mesenteric lymphadenopathy. No pelvic sidewall lymphadenopathy.  Reproductive: Uterus unremarkable. Interval progression of a complex multicystic lesion in the right posterior pelvis measuring 7.0 x 3.8 x 4.4 cm. No left adnexal mass.  Other: No intraperitoneal free fluid.  Musculoskeletal: No worrisome lytic or sclerotic osseous abnormality.  IMPRESSION: 1. Interval progression of a complex multicystic lesion in the right posterior pelvis measuring 7.0 x 3.8 x 4.4 cm. Imaging features raise concern for ovarian neoplasm. Pelvic ultrasound recommended to further evaluate. 2. No ascites.  These results will be called to the ordering clinician or representative by the Radiologist Assistant, and communication documented in the PACS or [employer].   Electronically Signed By: Camellia Candle M.D. On: 07/04/2023 14:06

## 2023-12-11 NOTE — Patient Instructions (Signed)
 Preparing for your Surgery  Plan for surgery on January 02, 2024 with Dr. Hoy Masters at [facility]. You will be scheduled for robotic assisted laparoscopic bilateral salpingo-oophorectomy (removal of both ovaries and fallopian tubes), possible robotic assisted total laparoscopic hysterectomy (removal of the uterus and cervix), possible staging if a precancer or cancer is seen.  Pre-operative Testing -You will receive a phone call from presurgical testing at [facility] to arrange for a pre-operative appointment and lab work.  -Bring your insurance card, copy of an advanced directive if applicable, medication list  -At that visit, you will be asked to sign a consent for a possible blood transfusion in case a transfusion becomes necessary during surgery.  The need for a blood transfusion is rare but having consent is a necessary part of your care.     -You should not be taking blood thinners or aspirin at least ten days prior to surgery unless instructed by your surgeon.  -Do not take supplements such as fish oil (omega 3), red yeast rice, turmeric before your surgery. STOP TAKING AT LEAST 10 DAYS BEFORE SURGERY. You want to avoid medications with aspirin in them including headache powders such as BC or Goody's), Excedrin migraine.  Day Before Surgery at Home -You will be asked to take in a light diet the day before surgery. You will be advised you can have clear liquids up until 3 hours before your surgery.    Eat a light diet the day before surgery.  Examples including soups, broths, toast, yogurt, mashed potatoes.  AVOID GAS PRODUCING FOODS AND BEVERAGES. Things to avoid include carbonated beverages (fizzy beverages, sodas), raw fruits and raw vegetables (uncooked), or beans.   If your bowels are filled with gas, your surgeon will have difficulty visualizing your pelvic organs which increases your surgical risks.  Your role in recovery Your role is to become active as  soon as directed by your doctor, while still giving yourself time to heal.  Rest when you feel tired. You will be asked to do the following in order to speed your recovery:  - Cough and breathe deeply. This helps to clear and expand your lungs and can prevent pneumonia after surgery.  - STAY ACTIVE WHEN YOU GET HOME. Do mild physical activity. Walking or moving your legs help your circulation and body functions return to normal. Do not try to get up or walk alone the first time after surgery.   -If you develop swelling on one leg or the other, pain in the back of your leg, redness/warmth in one of your legs, please call the office or go to the Emergency Room to have a doppler to rule out a blood clot. For shortness of breath, chest pain-seek care in the Emergency Room as soon as possible. - Actively manage your pain. Managing your pain lets you move in comfort. We will ask you to rate your pain on a scale of zero to 10. It is your responsibility to tell your doctor or nurse where and how much you hurt so your pain can be treated.  Special Considerations -If you are diabetic, you may be placed on insulin after surgery to have closer control over your blood sugars to promote healing and recovery.  This does not mean that you will be discharged on insulin.  If applicable, your oral antidiabetics will be resumed when you are tolerating a solid diet.  -Your final pathology results from surgery should be available around one week after  surgery and the results will be relayed to you when available.  -Dr. Olam Mill is the surgeon that assists your GYN Oncologist with surgery.  If you end up staying the night, the next day after your surgery you will either see Dr. Viktoria, Dr. Eldonna, or Dr. Olam Mill.  -FMLA forms can be faxed to [phone] and please allow 5-7 business days for completion.  Pain Management After Surgery -You will be prescribed your pain medication (tramadol ) and bowel  regimen medications before surgery so that you can have these available when you are discharged from the hospital. The pain medication is for use ONLY AFTER surgery and a new prescription will not be given. Use the tramadol  sparingly and only as needed since this can interact with your Cymbalta.   -Make sure that you have Tylenol  IF YOU ARE ABLE TO TAKE THESE MEDICATION at home to use on a regular basis after surgery for pain control.   -Review the attached handout on narcotic use and their risks and side effects.   Bowel Regimen -You will be prescribed Sennakot-S to take nightly to prevent constipation especially if you are taking the narcotic pain medication intermittently.  It is important to prevent constipation and drink adequate amounts of liquids. You can stop taking this medication when you are not taking pain medication and you are back on your normal bowel routine.  Risks of Surgery Risks of surgery are low but include bleeding, infection, damage to surrounding structures, re-operation, blood clots, and very rarely death.   Blood Transfusion Information (For the consent to be signed before surgery)  We will be checking your blood type before surgery so in case of emergencies, we will know what type of blood you would need.                                            WHAT IS A BLOOD TRANSFUSION?  A transfusion is the replacement of blood or some of its parts. Blood is made up of multiple cells which provide different functions. Red blood cells carry oxygen and are used for blood loss replacement. White blood cells fight against infection. Platelets control bleeding. Plasma helps clot blood. Other blood products are available for specialized needs, such as hemophilia or other clotting disorders. BEFORE THE TRANSFUSION  Who gives blood for transfusions?  You may be able to donate blood to be used at a later date on yourself (autologous donation). Relatives can be asked to donate  blood. This is generally not any safer than if you have received blood from a stranger. The same precautions are taken to ensure safety when a relative's blood is donated. Healthy volunteers who are fully evaluated to make sure their blood is safe. This is blood bank blood. Transfusion therapy is the safest it has ever been in the practice of medicine. Before blood is taken from a donor, a complete history is taken to make sure that person has no history of diseases nor engages in risky social behavior (examples are intravenous drug use or sexual activity with multiple partners). The donor's travel history is screened to minimize risk of transmitting infections, such as malaria. The donated blood is tested for signs of infectious diseases, such as HIV and hepatitis. The blood is then tested to be sure it is compatible with you in order to minimize the chance of a transfusion reaction.  If you or a relative donates blood, this is often done in anticipation of surgery and is not appropriate for emergency situations. It takes many days to process the donated blood. RISKS AND COMPLICATIONS Although transfusion therapy is very safe and saves many lives, the main dangers of transfusion include:  Getting an infectious disease. Developing a transfusion reaction. This is an allergic reaction to something in the blood you were given. Every precaution is taken to prevent this. The decision to have a blood transfusion has been considered carefully by your caregiver before blood is given. Blood is not given unless the benefits outweigh the risks.  AFTER SURGERY INSTRUCTIONS  Return to work: 4-6 weeks if applicable  Activity: 1. Be up and out of the bed during the day.  Take a nap if needed.  You may walk up steps but be careful and use the hand rail.  Stair climbing will tire you more than you think, you may need to stop part way and rest.   2. No lifting or straining for 6 weeks over 10 pounds. No pushing,  pulling, straining for 6 weeks.  3. No driving for 4-89 days when the following criteria have been met: Do not drive if you are taking narcotic pain medicine and make sure that your reaction time has returned.   4. You can shower as soon as the next day after surgery. Shower daily.  Use your regular soap and water  (not directly on the incision) and pat your incision(s) dry afterwards; don't rub.  No tub baths or submerging your body in water  until cleared by your surgeon. If you have the soap that was given to you by pre-surgical testing that was used before surgery, you do not need to use it afterwards because this can irritate your incisions.   5. No sexual activity and nothing in the vagina for 4-6 weeks, 12 weeks if you have a hysterectomy (removal of the uterus and cervix).  6. You may experience a small amount of clear drainage from your incisions, which is normal.  If the drainage persists, increases, or changes color please call the office.  7. Do not use creams, lotions, or ointments such as neosporin on your incisions after surgery until advised by your surgeon because they can cause removal of the dermabond glue on your incisions.    8. You may experience vaginal spotting after surgery or when the stitches at the top of the vagina begin to dissolve (if you have a hysterectomy).  The spotting is normal but if you experience heavy bleeding, call our office.  9. Take Tylenol  first for pain if you are able to take these medication and only use narcotic pain medication for severe pain not relieved by the Tylenol .  Monitor your Tylenol  intake to a max of 4,000 mg in a 24 hour period.   Diet: 1. Low sodium Heart Healthy Diet is recommended but you are cleared to resume your normal (before surgery) diet after your procedure.  2. It is safe to use a laxative, such as Miralax or Colace, if you have difficulty moving your bowels before surgery. You have been prescribed Sennakot-S to take at  bedtime every evening after surgery to keep bowel movements regular and to prevent constipation.    Wound Care: 1. Keep clean and dry.  Shower daily.  Reasons to call the Doctor: Fever - Oral temperature greater than 100.4 degrees Fahrenheit Foul-smelling vaginal discharge Difficulty urinating Nausea and vomiting Increased pain at the site of  the incision that is unrelieved with pain medicine. Difficulty breathing with or without chest pain New calf pain especially if only on one side Sudden, continuing increased vaginal bleeding with or without clots.   Contacts: For questions or concerns you should contact:  Dr. Hoy Masters at [phone]  Eleanor Epps, NP at [phone]  After Hours: call [phone] and have the GYN Oncologist paged/contacted (after 5 pm or on the weekends). You will speak with an after hours RN and let he or she know you have had surgery.  Messages sent via mychart are for non-urgent matters and are not responded to after hours so for urgent needs, please call the after hours number.

## 2023-12-11 NOTE — Patient Instructions (Addendum)
 Preparing for your Surgery  Plan for surgery on January 02, 2024 with Dr. Hoy Masters at [facility]. You will be scheduled for robotic assisted laparoscopic bilateral salpingo-oophorectomy (removal of both ovaries and fallopian tubes), possible robotic assisted total laparoscopic hysterectomy (removal of the uterus and cervix), possible staging if a precancer or cancer is seen.  Pre-operative Testing -You will receive a phone call from presurgical testing at [facility] to arrange for a pre-operative appointment and lab work.  -Bring your insurance card, copy of an advanced directive if applicable, medication list  -At that visit, you will be asked to sign a consent for a possible blood transfusion in case a transfusion becomes necessary during surgery.  The need for a blood transfusion is rare but having consent is a necessary part of your care.     -You should not be taking blood thinners or aspirin at least ten days prior to surgery unless instructed by your surgeon.  -Do not take supplements such as fish oil (omega 3), red yeast rice, turmeric before your surgery. STOP TAKING AT LEAST 10 DAYS BEFORE SURGERY. You want to avoid medications with aspirin in them including headache powders such as BC or Goody's), Excedrin migraine.  Day Before Surgery at Home -You will be asked to take in a light diet the day before surgery. You will be advised you can have clear liquids up until 3 hours before your surgery.    Eat a light diet the day before surgery.  Examples including soups, broths, toast, yogurt, mashed potatoes.  AVOID GAS PRODUCING FOODS AND BEVERAGES. Things to avoid include carbonated beverages (fizzy beverages, sodas), raw fruits and raw vegetables (uncooked), or beans.   If your bowels are filled with gas, your surgeon will have difficulty visualizing your pelvic organs which increases your surgical risks.  Your role in recovery Your role is to become active as  soon as directed by your doctor, while still giving yourself time to heal.  Rest when you feel tired. You will be asked to do the following in order to speed your recovery:  - Cough and breathe deeply. This helps to clear and expand your lungs and can prevent pneumonia after surgery.  - STAY ACTIVE WHEN YOU GET HOME. Do mild physical activity. Walking or moving your legs help your circulation and body functions return to normal. Do not try to get up or walk alone the first time after surgery.   -If you develop swelling on one leg or the other, pain in the back of your leg, redness/warmth in one of your legs, please call the office or go to the Emergency Room to have a doppler to rule out a blood clot. For shortness of breath, chest pain-seek care in the Emergency Room as soon as possible. - Actively manage your pain. Managing your pain lets you move in comfort. We will ask you to rate your pain on a scale of zero to 10. It is your responsibility to tell your doctor or nurse where and how much you hurt so your pain can be treated.  Special Considerations -If you are diabetic, you may be placed on insulin after surgery to have closer control over your blood sugars to promote healing and recovery.  This does not mean that you will be discharged on insulin.  If applicable, your oral antidiabetics will be resumed when you are tolerating a solid diet.  -Your final pathology results from surgery should be available around one week after  surgery and the results will be relayed to you when available.  -Dr. Olam Mill is the surgeon that assists your GYN Oncologist with surgery.  If you end up staying the night, the next day after your surgery you will either see Dr. Viktoria, Dr. Eldonna, or Dr. Olam Mill.  -FMLA forms can be faxed to [phone] and please allow 5-7 business days for completion.  Pain Management After Surgery -You will be prescribed your pain medication (tramadol ) and bowel  regimen medications before surgery so that you can have these available when you are discharged from the hospital. The pain medication is for use ONLY AFTER surgery and a new prescription will not be given. Use the tramadol  sparingly and only as needed since this can interact with your Cymbalta.   -Make sure that you have Tylenol  IF YOU ARE ABLE TO TAKE THESE MEDICATION at home to use on a regular basis after surgery for pain control.   -Review the attached handout on narcotic use and their risks and side effects.   Bowel Regimen -You will be prescribed Sennakot-S to take nightly to prevent constipation especially if you are taking the narcotic pain medication intermittently.  It is important to prevent constipation and drink adequate amounts of liquids. You can stop taking this medication when you are not taking pain medication and you are back on your normal bowel routine.  Risks of Surgery Risks of surgery are low but include bleeding, infection, damage to surrounding structures, re-operation, blood clots, and very rarely death.   Blood Transfusion Information (For the consent to be signed before surgery)  We will be checking your blood type before surgery so in case of emergencies, we will know what type of blood you would need.                                            WHAT IS A BLOOD TRANSFUSION?  A transfusion is the replacement of blood or some of its parts. Blood is made up of multiple cells which provide different functions. Red blood cells carry oxygen and are used for blood loss replacement. White blood cells fight against infection. Platelets control bleeding. Plasma helps clot blood. Other blood products are available for specialized needs, such as hemophilia or other clotting disorders. BEFORE THE TRANSFUSION  Who gives blood for transfusions?  You may be able to donate blood to be used at a later date on yourself (autologous donation). Relatives can be asked to donate  blood. This is generally not any safer than if you have received blood from a stranger. The same precautions are taken to ensure safety when a relative's blood is donated. Healthy volunteers who are fully evaluated to make sure their blood is safe. This is blood bank blood. Transfusion therapy is the safest it has ever been in the practice of medicine. Before blood is taken from a donor, a complete history is taken to make sure that person has no history of diseases nor engages in risky social behavior (examples are intravenous drug use or sexual activity with multiple partners). The donor's travel history is screened to minimize risk of transmitting infections, such as malaria. The donated blood is tested for signs of infectious diseases, such as HIV and hepatitis. The blood is then tested to be sure it is compatible with you in order to minimize the chance of a transfusion reaction.  If you or a relative donates blood, this is often done in anticipation of surgery and is not appropriate for emergency situations. It takes many days to process the donated blood. RISKS AND COMPLICATIONS Although transfusion therapy is very safe and saves many lives, the main dangers of transfusion include:  Getting an infectious disease. Developing a transfusion reaction. This is an allergic reaction to something in the blood you were given. Every precaution is taken to prevent this. The decision to have a blood transfusion has been considered carefully by your caregiver before blood is given. Blood is not given unless the benefits outweigh the risks.  AFTER SURGERY INSTRUCTIONS  Return to work: 4-6 weeks if applicable  Activity: 1. Be up and out of the bed during the day.  Take a nap if needed.  You may walk up steps but be careful and use the hand rail.  Stair climbing will tire you more than you think, you may need to stop part way and rest.   2. No lifting or straining for 6 weeks over 10 pounds. No pushing,  pulling, straining for 6 weeks.  3. No driving for 4-89 days when the following criteria have been met: Do not drive if you are taking narcotic pain medicine and make sure that your reaction time has returned.   4. You can shower as soon as the next day after surgery. Shower daily.  Use your regular soap and water  (not directly on the incision) and pat your incision(s) dry afterwards; don't rub.  No tub baths or submerging your body in water  until cleared by your surgeon. If you have the soap that was given to you by pre-surgical testing that was used before surgery, you do not need to use it afterwards because this can irritate your incisions.   5. No sexual activity and nothing in the vagina for 4-6 weeks, 12 weeks if you have a hysterectomy (removal of the uterus and cervix).  6. You may experience a small amount of clear drainage from your incisions, which is normal.  If the drainage persists, increases, or changes color please call the office.  7. Do not use creams, lotions, or ointments such as neosporin on your incisions after surgery until advised by your surgeon because they can cause removal of the dermabond glue on your incisions.    8. You may experience vaginal spotting after surgery or when the stitches at the top of the vagina begin to dissolve (if you have a hysterectomy).  The spotting is normal but if you experience heavy bleeding, call our office.  9. Take Tylenol  first for pain if you are able to take these medication and only use narcotic pain medication for severe pain not relieved by the Tylenol .  Monitor your Tylenol  intake to a max of 4,000 mg in a 24 hour period.   Diet: 1. Low sodium Heart Healthy Diet is recommended but you are cleared to resume your normal (before surgery) diet after your procedure.  2. It is safe to use a laxative, such as Miralax or Colace, if you have difficulty moving your bowels before surgery. You have been prescribed Sennakot-S to take at  bedtime every evening after surgery to keep bowel movements regular and to prevent constipation.    Wound Care: 1. Keep clean and dry.  Shower daily.  Reasons to call the Doctor: Fever - Oral temperature greater than 100.4 degrees Fahrenheit Foul-smelling vaginal discharge Difficulty urinating Nausea and vomiting Increased pain at the site of  the incision that is unrelieved with pain medicine. Difficulty breathing with or without chest pain New calf pain especially if only on one side Sudden, continuing increased vaginal bleeding with or without clots.   Contacts: For questions or concerns you should contact:  Dr. Hoy Masters at [phone]  Eleanor Epps, NP at [phone]  After Hours: call [phone] and have the GYN Oncologist paged/contacted (after 5 pm or on the weekends). You will speak with an after hours RN and let he or she know you have had surgery.  Messages sent via mychart are for non-urgent matters and are not responded to after hours so for urgent needs, please call the after hours number.

## 2023-12-11 NOTE — Progress Notes (Signed)
 Patient here for new patient consultation with Dr. Eldonna and for a pre-operative appointment prior to her scheduled surgery on 01/02/2024. She is scheduled for robotic assisted laparoscopic bilateral salpingo-oophorectomy, possible robotic assisted total laparoscopic hysterectomy, possible staging if a precancer or cancer is seen. The surgery was discussed in detail.  See after visit summary for additional details.    Discussed post-op pain management in detail including the aspects of the enhanced recovery pathway.  Advised her that a new prescription would be sent in for tramadol  and it is only to be used for after her upcoming surgery.  We discussed the use of tylenol  post-op and to monitor for a maximum of 4,000 mg in a 24 hour period.  Also prescribed sennakot to be used after surgery and to hold if having loose stools.  Discussed bowel regimen in detail.     Discussed measures to take at home to prevent DVT including frequent mobility.  Reportable signs and symptoms of DVT discussed. Post-operative instructions discussed and expectations for after surgery. Incisional care discussed as well including reportable signs and symptoms including erythema, drainage, wound separation.     10 minutes spent preparing information and with the patient.  Verbalizing understanding of material discussed. No needs or concerns voiced at the end of the visit.   Advised patient to call for any needs.  Advised that her post-operative medications had been prescribed and could be picked up at any time.    This appointment is included in the global surgical bundle as pre-operative teaching and has no charge.

## 2023-12-13 LAB — SURGICAL PATHOLOGY

## 2023-12-14 ENCOUNTER — Encounter: Payer: Self-pay | Admitting: Psychiatry

## 2023-12-14 DIAGNOSIS — R159 Full incontinence of feces: Secondary | ICD-10-CM | POA: Diagnosis not present

## 2023-12-14 DIAGNOSIS — R195 Other fecal abnormalities: Secondary | ICD-10-CM | POA: Diagnosis not present

## 2023-12-14 DIAGNOSIS — R1900 Intra-abdominal and pelvic swelling, mass and lump, unspecified site: Secondary | ICD-10-CM | POA: Diagnosis not present

## 2023-12-18 ENCOUNTER — Telehealth: Payer: Self-pay | Admitting: Surgery

## 2023-12-18 DIAGNOSIS — M67911 Unspecified disorder of synovium and tendon, right shoulder: Secondary | ICD-10-CM | POA: Diagnosis not present

## 2023-12-18 NOTE — Telephone Encounter (Signed)
Called patient and discussed with her below message. Patient verbalized understanding and had no other concerns at this time.

## 2023-12-18 NOTE — Progress Notes (Signed)
Anesthesia Review:  PCP: Cardiologist : Chest x-ray : EKG : Echo : Stress test: Cardiac Cath :  Activity level:  Sleep Study/ CPAP : Fasting Blood Sugar :      / Checks Blood Sugar -- times a day:   Blood Thinner/ Instructions /Last Dose: ASA / Instructions/ Last Dose :  

## 2023-12-18 NOTE — Telephone Encounter (Signed)
-----   Message from Doylene Bode sent at 12/18/2023 10:59 AM EST ----- This patient had sent a mychart message last week. She had mentioned if surgery was not necessary, she would rather not have surgery. Dr. Alvester Morin would like for you to reach out and tell the patient, the biopsy taken in the office was from the lining of the uterus. This is separate from the pelvic mass that has gotten bigger. She is still recommending surgery to remove the pelvic mass esp since it has grown and send for pathology eval to test for cancer during surgery.

## 2023-12-18 NOTE — Patient Instructions (Signed)
SURGICAL WAITING ROOM VISITATION  Patients having surgery or a procedure may have no more than 2 support people in the waiting area - these visitors may rotate.    Children under the age of 21 must have an adult with them who is not the patient.  Due to an increase in RSV and influenza rates and associated hospitalizations, children ages 33 and under may not visit patients in [facility] hospitals.  Visitors with respiratory illnesses are discouraged from visiting and should remain at home.  If the patient needs to stay at the hospital during part of their recovery, the visitor guidelines for inpatient rooms apply. Pre-op nurse will coordinate an appropriate time for 1 support person to accompany patient in pre-op.  This support person may not rotate.    Please refer to the [facility] website for the visitor guidelines for Inpatients (after your surgery is over and you are in a regular room).       Your procedure is scheduled on:  01/02/24    Report to [facility] Main Entrance    Report to admitting at  0830 AM   Call this number if you have problems the morning of surgery [phone]   Do not eat food :After Midnight.            Eat a light diet the day before surgery. Avoid gas producing foods.    After Midnight you may have the following liquids until _ 0730_____ AM  DAY OF SURGERY  Water Non-Citrus Juices (without pulp, NO RED-Apple, White grape, White cranberry) Black Coffee (NO MILK/CREAM OR CREAMERS, sugar ok)  Clear Tea (NO MILK/CREAM OR CREAMERS, sugar ok) regular and decaf                             Plain Jell-O (NO RED)                                           Fruit ices (not with fruit pulp, NO RED)                                     Popsicles (NO RED)                                                               Sports drinks like Gatorade (NO RED)                              If you have questions, please contact your surgeon's office.        Oral Hygiene is also important to reduce your risk of infection.                                    Remember - BRUSH YOUR TEETH THE MORNING OF SURGERY WITH YOUR REGULAR TOOTHPASTE  DENTURES WILL BE REMOVED PRIOR TO SURGERY PLEASE DO NOT APPLY "Poly grip" OR ADHESIVES!!!  Do NOT smoke after Midnight   Stop all vitamins and herbal supplements 7 days before surgery.   Take these medicines the morning of surgery with A SIP OF WATER: none    DO NOT TAKE ANY ORAL DIABETIC MEDICATIONS DAY OF YOUR SURGERY  Bring CPAP mask and tubing day of surgery.                              You may not have any metal on your body including hair pins, jewelry, and body piercing             Do not wear make-up, lotions, powders, perfumes/cologne, or deodorant  Do not wear nail polish including gel and S&S, artificial/acrylic nails, or any other type of covering on natural nails including finger and toenails. If you have artificial nails, gel coating, etc. that needs to be removed by a nail salon please have this removed prior to surgery or surgery may need to be canceled/ delayed if the surgeon/ anesthesia feels like they are unable to be safely monitored.   Do not shave  48 hours prior to surgery.               Men may shave face and neck.   Do not bring valuables to the hospital. [facility] IS NOT             RESPONSIBLE   FOR VALUABLES.   Contacts, glasses, dentures or bridgework may not be worn into surgery.   Bring small overnight bag day of surgery.   DO NOT BRING YOUR HOME MEDICATIONS TO THE HOSPITAL. PHARMACY WILL DISPENSE MEDICATIONS LISTED ON YOUR MEDICATION LIST TO YOU DURING YOUR ADMISSION IN THE HOSPITAL!    Patients discharged on the day of surgery will not be allowed to drive home.  Someone NEEDS to stay with you for the first 24 hours after anesthesia.   Special Instructions: Bring a copy of your healthcare power of attorney and living will documents the day of surgery if you haven't  scanned them before.              Please read over the following fact sheets you were given: IF YOU HAVE QUESTIONS ABOUT YOUR PRE-OP INSTRUCTIONS PLEASE CALL [phone]   If you received a COVID test during your pre-op visit  it is requested that you wear a mask when out in public, stay away from anyone that may not be feeling well and notify your surgeon if you develop symptoms. If you test positive for Covid or have been in contact with anyone that has tested positive in the last 10 days please notify you surgeon.    [facility] - Preparing for Surgery Before surgery, you can play an important role.  Because skin is not sterile, your skin needs to be as free of germs as possible.  You can reduce the number of germs on your skin by washing with CHG (chlorahexidine gluconate) soap before surgery.  CHG is an antiseptic cleaner which kills germs and bonds with the skin to continue killing germs even after washing. Please DO NOT use if you have an allergy to CHG or antibacterial soaps.  If your skin becomes reddened/irritated stop using the CHG and inform your nurse when you arrive at Short Stay. Do not shave (including legs and underarms) for at least 48 hours prior to the first CHG shower.  You may shave your face/neck. Please follow these  instructions carefully:  1.  Shower with CHG Soap the night before surgery and the  morning of Surgery.  2.  If you choose to wash your hair, wash your hair first as usual with your  normal  shampoo.  3.  After you shampoo, rinse your hair and body thoroughly to remove the  shampoo.                           4.  Use CHG as you would any other liquid soap.  You can apply chg directly  to the skin and wash                       Gently with a scrungie or clean washcloth.  5.  Apply the CHG Soap to your body ONLY FROM THE NECK DOWN.   Do not use on face/ open                           Wound or open sores. Avoid contact with eyes, ears mouth and genitals (private  parts).                       Wash face,  Genitals (private parts) with your normal soap.             6.  Wash thoroughly, paying special attention to the area where your surgery  will be performed.  7.  Thoroughly rinse your body with warm water from the neck down.  8.  DO NOT shower/wash with your normal soap after using and rinsing off  the CHG Soap.                9.  Pat yourself dry with a clean towel.            10.  Wear clean pajamas.            11.  Place clean sheets on your bed the night of your first shower and do not  sleep with pets. Day of Surgery : Do not apply any lotions/deodorants the morning of surgery.  Please wear clean clothes to the hospital/surgery center.  FAILURE TO FOLLOW THESE INSTRUCTIONS MAY RESULT IN THE CANCELLATION OF YOUR SURGERY PATIENT SIGNATURE_________________________________  NURSE SIGNATURE__________________________________  ________________________________________________________________________

## 2023-12-21 ENCOUNTER — Other Ambulatory Visit: Payer: Self-pay

## 2023-12-21 ENCOUNTER — Encounter (HOSPITAL_COMMUNITY)
Admission: RE | Admit: 2023-12-21 | Discharge: 2023-12-21 | Disposition: A | Discharge status: Home / Self Care | Payer: PPO | Source: Ambulatory Visit | Attending: Psychiatry | Admitting: Psychiatry

## 2023-12-21 ENCOUNTER — Encounter (HOSPITAL_COMMUNITY): Payer: Self-pay

## 2023-12-21 VITALS — BP 108/63 | HR 80 | Temp 98.4°F | Resp 16 | Ht 64.0 in | Wt 114.0 lb

## 2023-12-21 DIAGNOSIS — R1012 Left upper quadrant pain: Secondary | ICD-10-CM | POA: Diagnosis not present

## 2023-12-21 DIAGNOSIS — R9389 Abnormal findings on diagnostic imaging of other specified body structures: Secondary | ICD-10-CM | POA: Insufficient documentation

## 2023-12-21 DIAGNOSIS — Z01812 Encounter for preprocedural laboratory examination: Secondary | ICD-10-CM | POA: Diagnosis not present

## 2023-12-21 DIAGNOSIS — Z01818 Encounter for other preprocedural examination: Secondary | ICD-10-CM

## 2023-12-21 DIAGNOSIS — R1900 Intra-abdominal and pelvic swelling, mass and lump, unspecified site: Secondary | ICD-10-CM | POA: Insufficient documentation

## 2023-12-21 HISTORY — DX: Anxiety disorder, unspecified: F41.9

## 2023-12-21 HISTORY — DX: Other specified postprocedural states: Z98.890

## 2023-12-21 HISTORY — DX: Inflammatory liver disease, unspecified: K75.9

## 2023-12-21 LAB — COMPREHENSIVE METABOLIC PANEL
ALT: 14 U/L (ref 0–44)
AST: 18 U/L (ref 15–41)
Albumin: 3.9 g/dL (ref 3.5–5.0)
Alkaline Phosphatase: 80 U/L (ref 38–126)
Anion gap: 11 (ref 5–15)
BUN: 22 mg/dL (ref 8–23)
CO2: 26 mmol/L (ref 22–32)
Calcium: 8.9 mg/dL (ref 8.9–10.3)
Chloride: 104 mmol/L (ref 98–111)
Creatinine, Ser: 0.49 mg/dL (ref 0.44–1.00)
GFR, Estimated: >60 mL/min (ref >60)
Glucose, Bld: 86 mg/dL (ref 70–99)
Potassium: 3.5 mmol/L (ref 3.5–5.1)
Sodium: 141 mmol/L (ref 135–145)
Total Bilirubin: 0.4 mg/dL (ref 0.0–1.2)
Total Protein: 6.6 g/dL (ref 6.5–8.1)

## 2023-12-21 LAB — CBC
HCT: 42.2 % (ref 36.0–46.0)
Hemoglobin: 13.3 g/dL (ref 12.0–15.0)
MCH: 29.6 pg (ref 26.0–34.0)
MCHC: 31.5 g/dL (ref 30.0–36.0)
MCV: 93.8 fL (ref 80.0–100.0)
Platelets: 229 10*3/uL (ref 150–400)
RBC: 4.5 MIL/uL (ref 3.87–5.11)
RDW: 14.2 % (ref 11.5–15.5)
WBC: 8.6 10*3/uL (ref 4.0–10.5)
nRBC: 0 % (ref 0.0–0.2)

## 2023-12-21 LAB — TYPE AND SCREEN
ABO/RH(D): O POS
Antibody Screen: NEGATIVE

## 2023-12-26 ENCOUNTER — Telehealth: Payer: Self-pay | Admitting: *Deleted

## 2023-12-26 NOTE — Telephone Encounter (Addendum)
Spoke with Catherine Stone who called the office to leave a message for Dr. Alvester Morin. Pt is scheduled for surgery on 2/4 to have her ovaries removed and she would like Dr.Newton to remove her uterus as well.  Pt advised her message would be relayed to provider.

## 2023-12-28 ENCOUNTER — Ambulatory Visit: Payer: PPO | Admitting: Physical Medicine and Rehabilitation

## 2023-12-30 ENCOUNTER — Telehealth: Payer: Self-pay | Admitting: Psychiatry

## 2023-12-30 NOTE — Telephone Encounter (Signed)
Called pt to see if she would be able to move her surgery to 2/18 to aid in scheduling of an urgent procedure. She is not available for surgery on 2/11. She will consider. She was hoping to see if Dr. Dossie Der would be available to view intraoperatively her LUQ given pain and history of Nissen fundoplication. He is not available on 2/4. I discussed that I would view the upper abdomen and could take pictures. She would prefer not to wait until 2/18 for surgery but she wants to call and see if Dr. Dossie Der is available on 2/18 instead, in which case she may be willing to move date. Will have our team follow-up on Monday with her.

## 2024-01-01 ENCOUNTER — Telehealth: Payer: Self-pay | Admitting: Physical Medicine and Rehabilitation

## 2024-01-01 NOTE — Telephone Encounter (Signed)
Patient called needing to schedule an appointment with Dr. Alvester Morin or [facility] for her back. Patient said she is having surgery 01/16/2024. The number to contact patient [phone]

## 2024-01-02 DIAGNOSIS — R1900 Intra-abdominal and pelvic swelling, mass and lump, unspecified site: Secondary | ICD-10-CM

## 2024-01-02 DIAGNOSIS — R9389 Abnormal findings on diagnostic imaging of other specified body structures: Secondary | ICD-10-CM

## 2024-01-08 ENCOUNTER — Ambulatory Visit: Payer: PPO | Admitting: Physical Medicine and Rehabilitation

## 2024-01-09 ENCOUNTER — Encounter (HOSPITAL_COMMUNITY)
Admission: RE | Admit: 2024-01-09 | Discharge: 2024-01-09 | Disposition: A | Discharge status: Home / Self Care | Payer: PPO | Source: Ambulatory Visit | Attending: Psychiatry | Admitting: Psychiatry

## 2024-01-09 DIAGNOSIS — Z01812 Encounter for preprocedural laboratory examination: Secondary | ICD-10-CM | POA: Diagnosis not present

## 2024-01-09 DIAGNOSIS — Z01818 Encounter for other preprocedural examination: Secondary | ICD-10-CM

## 2024-01-09 NOTE — Patient Instructions (Signed)
SURGICAL WAITING ROOM VISITATION  Patients having surgery or a procedure may have no more than 2 support people in the waiting area - these visitors may rotate.    Children under the age of 21 must have an adult with them who is not the patient.  Due to an increase in RSV and influenza rates and associated hospitalizations, children ages 54 and under may not visit patients in [facility] hospitals.  Visitors with respiratory illnesses are discouraged from visiting and should remain at home.  If the patient needs to stay at the hospital during part of their recovery, the visitor guidelines for inpatient rooms apply. Pre-op nurse will coordinate an appropriate time for 1 support person to accompany patient in pre-op.  This support person may not rotate.    Please refer to the [facility] website for the visitor guidelines for Inpatients (after your surgery is over and you are in a regular room).    Your procedure is scheduled on: 01/11/24   Report to [facility] Main Entrance    Report to admitting at 10:15 AM   Call this number if you have problems the morning of surgery [phone]   Do not eat food or drink liquids :After Midnight.   After Midnight you may have the following liquids until ______ AM/ PM DAY OF SURGERY         If you have questions, please contact your surgeon's office.   FOLLOW BOWEL PREP AND ANY ADDITIONAL PRE OP INSTRUCTIONS YOU RECEIVED FROM YOUR SURGEON'S OFFICE!!!     Oral Hygiene is also important to reduce your risk of infection.                                    Remember - BRUSH YOUR TEETH THE MORNING OF SURGERY WITH YOUR REGULAR TOOTHPASTE  DENTURES WILL BE REMOVED PRIOR TO SURGERY PLEASE DO NOT APPLY "Poly grip" OR ADHESIVES!!!   Stop all vitamins and herbal supplements 7 days before surgery.   Take these medicines the morning of surgery with A SIP OF WATER: None                               You may not have any metal on your body  including hair pins, jewelry, and body piercing             Do not wear make-up, lotions, powders, perfumes, or deodorant  Do not wear nail polish including gel and S&S, artificial/acrylic nails, or any other type of covering on natural nails including finger and toenails. If you have artificial nails, gel coating, etc. that needs to be removed by a nail salon please have this removed prior to surgery or surgery may need to be canceled/ delayed if the surgeon/ anesthesia feels like they are unable to be safely monitored.   Do not shave  48 hours prior to surgery.   Do not bring valuables to the hospital. [facility] IS NOT             RESPONSIBLE   FOR VALUABLES.   Contacts, glasses, dentures or bridgework may not be worn into surgery.  DO NOT BRING YOUR HOME MEDICATIONS TO THE HOSPITAL. PHARMACY WILL DISPENSE MEDICATIONS LISTED ON YOUR MEDICATION LIST TO YOU DURING YOUR ADMISSION IN THE HOSPITAL!    Patients discharged on the day of surgery will not  be allowed to drive home.  Someone NEEDS to stay with you for the first 24 hours after anesthesia.              Please read over the following fact sheets you were given: IF YOU HAVE QUESTIONS ABOUT YOUR PRE-OP INSTRUCTIONS PLEASE CALL [name]    If you received a COVID test during your pre-op visit  it is requested that you wear a mask when out in public, stay away from anyone that may not be feeling well and notify your surgeon if you develop symptoms. If you test positive for Covid or have been in contact with anyone that has tested positive in the last 10 days please notify you surgeon.    [facility] - Preparing for Surgery Before surgery, you can play an important role.  Because skin is not sterile, your skin needs to be as free of germs as possible.  You can reduce the number of germs on your skin by washing with CHG (chlorahexidine gluconate) soap before surgery.  CHG is an antiseptic cleaner which kills germs and bonds with  the skin to continue killing germs even after washing. Please DO NOT use if you have an allergy to CHG or antibacterial soaps.  If your skin becomes reddened/irritated stop using the CHG and inform your nurse when you arrive at Short Stay. Do not shave (including legs and underarms) for at least 48 hours prior to the first CHG shower.  You may shave your face/neck.  Please follow these instructions carefully:  1.  Shower with CHG Soap the night before surgery and the  morning of surgery.  2.  If you choose to wash your hair, wash your hair first as usual with your normal  shampoo.  3.  After you shampoo, rinse your hair and body thoroughly to remove the shampoo.                             4.  Use CHG as you would any other liquid soap.  You can apply chg directly to the skin and wash.  Gently with a scrungie or clean washcloth.  5.  Apply the CHG Soap to your body ONLY FROM THE NECK DOWN.   Do   not use on face/ open                           Wound or open sores. Avoid contact with eyes, ears mouth and   genitals (private parts).                       Wash face,  Genitals (private parts) with your normal soap.             6.  Wash thoroughly, paying special attention to the area where your    surgery  will be performed.  7.  Thoroughly rinse your body with warm water from the neck down.  8.  DO NOT shower/wash with your normal soap after using and rinsing off the CHG Soap.                9.  Pat yourself dry with a clean towel.            10.  Wear clean pajamas.            11.  Place clean sheets on your bed the night  of your first shower and do not  sleep with pets. Day of Surgery : Do not apply any lotions/deodorants the morning of surgery.  Please wear clean clothes to the hospital/surgery center.  FAILURE TO FOLLOW THESE INSTRUCTIONS MAY RESULT IN THE CANCELLATION OF YOUR SURGERY  PATIENT SIGNATURE_________________________________  NURSE  SIGNATURE__________________________________  ________________________________________________________________________

## 2024-01-11 ENCOUNTER — Ambulatory Visit: Payer: PPO | Admitting: Physical Medicine and Rehabilitation

## 2024-01-11 ENCOUNTER — Encounter: Payer: Self-pay | Admitting: Physical Medicine and Rehabilitation

## 2024-01-11 VITALS — BP 126/84 | HR 82

## 2024-01-11 DIAGNOSIS — G8929 Other chronic pain: Secondary | ICD-10-CM

## 2024-01-11 DIAGNOSIS — M546 Pain in thoracic spine: Secondary | ICD-10-CM | POA: Diagnosis not present

## 2024-01-11 DIAGNOSIS — M47814 Spondylosis without myelopathy or radiculopathy, thoracic region: Secondary | ICD-10-CM

## 2024-01-11 NOTE — Progress Notes (Signed)
Catherine Stone - 78 y.o. female MRN [id number]  Date of birth: Nov 10, 1946  Office Visit Note: Visit Date: 01/11/2024 PCP: Lezlie Lye, Meda Coffee, MD Referred by: Lezlie Lye, Meda Coffee, *  Subjective: Chief Complaint  Patient presents with   Middle Back - Pain   HPI: Catherine Stone is a 78 y.o. female who comes in today for evaluation of chronic, worsening and severe bilateral lower thoracic back pain. Pain ongoing for several years, her pain is constant, no specific aggravating factors. She reports chronic left sided upper quadrant abdominal pain that she feels is connected to her back issues. States she is confident that her pain is being caused by nerve damage from nissen fundoplication in the 1990's. She describes her pain as burning sensation, currently rates as 5 out of 10. Some relief of pain with home exercise regimen, rest and use of medications. She has tried several medications in the past including Ibuprofen, Robaxin, Gabapentin and Lyrica. No history of dedicated formal physical therapy for her thoracic spine. Thoracic MRI imaging from June of 2024 shows mild to moderate facet and ligament flavum hypertrophy. Trace degenerative facet joint fluid on the left. No significant neural impingement noted. She underwent left T10-T11 interlaminar epidural steroid injection in our office on 07/13/2023 with no relief of pain. She was previously evaluated by our spine surgeon Dr. Willia Craze, his notes can be further reviewed in [facility]. She is scheduled for bilateral salpingo oophorectomy with Dr. Clide Cliff on 01/16/2024. Patient denies focal weakness, numbness and tingling. No recent trauma or falls.       Review of Systems  Musculoskeletal:  Positive for back pain.  Neurological:  Negative for tingling, sensory change, focal weakness and weakness.  All other systems reviewed and are negative.  Otherwise per HPI.  Assessment & Plan: Visit Diagnoses:    ICD-10-CM   1. Chronic  bilateral thoracic back pain  M54.6 Ambulatory referral to Physical Medicine Rehab   G89.29     2. Facet arthropathy, thoracic  M47.814 Ambulatory referral to Physical Medicine Rehab       Plan: Findings:  Chronic, worsening and severe bilateral thoracic back pain. Patient continues to have severe pain despite good conservative therapies such as home exercise regimen, rest and use of medications. Patients clinical presentation and exam are consistent with facet mediated pain. There is mild to moderate facet arthropathy noted at the level of T9-T10. No relief with prior thoracic epidural steroid injection. We discussed treatment plan in detail. Next step is to perform diagnostic and hopefully therapeutic bilateral T9-T10 facet joint injections under fluoroscopic guidance. If good relief of pain we can repeat this procedure infrequently as needed. I discussed injection procedure with her in detail today, she has no questions at this time. Ideally, we would like to wait about 4 weeks post surgery to perform these injections. Should her pain persist would consider referral to more of a comprehensive pain management center. No red flag symptoms noted upon exam today.     Meds & Orders: No orders of the defined types were placed in this encounter.   Orders Placed This Encounter  Procedures   Ambulatory referral to Physical Medicine Rehab    Follow-up: Return for Bilateral T9-T10 facet joint injections.   Procedures: No procedures performed      Clinical History: Narrative & Impression CLINICAL DATA:  78 year old female with mid back pain radiating around the sides. No known injury.   EXAM: MRI THORACIC WITHOUT AND WITH CONTRAST  TECHNIQUE: Multiplanar and multiecho pulse sequences of the thoracic spine were obtained without and with intravenous contrast.   CONTRAST:  5mL GADAVIST GADOBUTROL 1 MMOL/ML IV SOLN   COMPARISON:  [facility] CT Chest, Abdomen, and Pelvis 11/10/2022,  thoracic radiographs 12/21/2022.   FINDINGS: Limited cervical spine imaging:  Age appropriate.   Thoracic spine segmentation:  Normal on the comparison CT.   Alignment: Stable thoracic kyphosis since last year. Slight dextroconvex thoracic scoliosis is stable. Subtle anterolisthesis of T2 on T3 and T3 on T4 is chronic, stable from the CT last year. And there is similar subtle anterolisthesis of T9 on T10, stable.   Vertebrae: Unknown artifact affecting the T9 thoracic level on sagittal sequences, not apparent on axial images. Background bone marrow signal within normal limits (occasional benign vertebral hemangiomas T10 and T11 on the right series 18, image 4). Maintained thoracic vertebral height. No convincing marrow edema or acute osseous abnormality.   Cord: Normal, with underlying capacious thoracic spinal canal. Conus medullaris appears normal at T12-L1. No abnormal intradural enhancement. No dural thickening.   Paraspinal and other soft tissues: Negative visible thoracic viscera.   Subcentimeter right thyroid nodule series 20, image 4. Not clinically significant; no follow-up imaging recommended (ref: J Am Coll Radiol. 2015 Feb;12(2): 143-50).   Stable small hepatic dome cyst from the CT last year on series 20, image 34 (no follow-up imaging recommended). And otherwise negative visible abdominal viscera.   Negative thoracic paraspinal soft tissues.   Disc levels:   T1-T2: Circumferential disc bulge eccentric to the right. Mild to moderate facet and ligament flavum hypertrophy. No spinal or convincing foraminal stenosis.   T2-T3: Mild anterolisthesis. Mild disc bulge. Mild to moderate facet hypertrophy on the right. No spinal stenosis. Mild right T2 neural foraminal stenosis.   T3-T4: Subtle anterolisthesis. Mild facet hypertrophy on the right. No stenosis.   T4-T5: Negative disc. Mild to moderate facet hypertrophy on the right. Mild right T4 neural foraminal  stenosis.   T5-T6: Negative.   T6-T7: Negative.   T7-T8: Small right paracentral disc bulge or protrusion (series 20, image 26). No stenosis.   T8-T9: Subtle disc bulging.  No stenosis.   T9-T10: Subtle anterolisthesis and disc bulging. Mild to moderate facet and ligament flavum hypertrophy. Trace degenerative facet joint fluid on the left (series 20, image 32). No stenosis.   T10-T11: Negative.   T11-T12: Circumferential disc bulge with a small right paracentral annular fissure (series 20, image 38). No stenosis.   T12-L1: Similar mild circumferential disc bulge and small right paracentral annular fissure, with tiny cephalad disc protrusion there (series 20, image 41). No stenosis.   No visible upper lumbar spinal stenosis.   IMPRESSION: 1. No acute osseous abnormality in the Thoracic Spine. Mild chronic thoracic anterolisthesis at several levels, primarily with facet degeneration. And mild lower thoracic disc degeneration eccentric to the right at T11-T12 and T12-L1. 2. No thoracic spinal stenosis and normal thoracic spinal cord. Occasional mild thoracic neural foraminal stenosis.     Electronically Signed   By: Odessa Fleming M.D.   On: 05/22/2023 08:13   She reports that she has never smoked. She has never used smokeless tobacco. No results for input(s): "HGBA1C", "LABURIC" in the last 8760 hours.  Objective:  VS:  HT:    WT:   BMI:     BP:126/84  HR:82bpm  TEMP: ( )  RESP:  Physical Exam Vitals and nursing note reviewed.  HENT:     Head: Normocephalic and atraumatic.  Right Ear: External ear normal.     Left Ear: External ear normal.     Nose: Nose normal.     Mouth/Throat:     Mouth: Mucous membranes are moist.  Eyes:     Extraocular Movements: Extraocular movements intact.  Cardiovascular:     Rate and Rhythm: Normal rate.     Pulses: Normal pulses.  Pulmonary:     Effort: Pulmonary effort is normal.  Abdominal:     General: Abdomen is flat. There  is no distension.  Musculoskeletal:        General: Tenderness present.     Cervical back: Normal range of motion.     Comments: Normal thoracic kyphosis noted. No tenderness noted upon palpation of spinous processes. No step off deformity. No pain noted with flexion, extension, lateral bending and rotation. Good strength noted to bilateral upper extremities.    Skin:    General: Skin is warm and dry.     Capillary Refill: Capillary refill takes less than 2 seconds.  Neurological:     General: No focal deficit present.     Mental Status: She is alert and oriented to person, place, and time.  Psychiatric:        Mood and Affect: Mood normal.        Behavior: Behavior normal.     Ortho Exam  Imaging: No results found.  Past Medical/Family/Surgical/Social History: Medications & Allergies reviewed per EMR, new medications updated. Patient Active Problem List   Diagnosis Date Noted   Thickened endometrium 12/11/2023   Pelvic mass 12/11/2023   Past Medical History:  Diagnosis Date   Anxiety    Depression    Family history of colon cancer    GERD (gastroesophageal reflux disease)    Hepatitis    inactive hep B   History of colon polyps    History of hepatitis B    Hypercholesterolemia    PONV (postoperative nausea and vomiting)    Family History  Problem Relation Age of Onset   Colon cancer Mother 51   Breast cancer Neg Hx    Ovarian cancer Neg Hx    Uterine cancer Neg Hx    Past Surgical History:  Procedure Laterality Date   COLONOSCOPY  09/23/2009   Colonic polyps status post polpectomy. Mild sigmoid diverticulosis. Small hemorrhoids.   ESOPHAGOGASTRODUODENOSCOPY  04/07/2020   Dr Jennye Boroughs. Possible early gastric antral vascular ectatic lesions. Evidence of prior fundoplication. Small hiatal hernia. Empiric esophageal dilation to [address] [place]   KNEE ARTHROSCOPY     LARYNGOPLASTY Bilateral    NISSEN FUNDOPLICATION  1995   ROTATOR CUFF REPAIR Right 2006   x 2    TUBAL LIGATION     Social History   Occupational History   Occupation: retired  Tobacco Use   Smoking status: Never   Smokeless tobacco: Never  Vaping Use   Vaping status: Never Used  Substance and Sexual Activity   Alcohol use: Never   Drug use: Never   Sexual activity: Yes    Partners: Male

## 2024-01-11 NOTE — Progress Notes (Signed)
Pain scale-5 No Contrast Allergies No Thinners

## 2024-01-15 ENCOUNTER — Telehealth: Payer: Self-pay | Admitting: Surgery

## 2024-01-15 ENCOUNTER — Encounter: Payer: PPO | Admitting: Psychiatry

## 2024-01-15 NOTE — Anesthesia Preprocedure Evaluation (Signed)
Anesthesia Evaluation  Patient identified by MRN, date of birth, ID band Patient awake    Reviewed: Allergy & Precautions, NPO status , Patient's Chart, lab work & pertinent test results  History of Anesthesia Complications (+) PONV  Airway Mallampati: I  TM Distance: >3 FB Neck ROM: Full    Dental  (+) Dental Advisory Given, Caps   Pulmonary neg pulmonary ROS   breath sounds clear to auscultation       Cardiovascular (-) angina negative cardio ROS  Rhythm:Regular Rate:Normal     Neuro/Psych   Anxiety Depression    negative neurological ROS     GI/Hepatic ,GERD  Medicated and Controlled,,(+) Hepatitis -, BS/p [employer]   Endo/Other  negative endocrine ROS    Renal/GU negative Renal ROS     Musculoskeletal   Abdominal   Peds  Hematology negative hematology ROS (+)   Anesthesia Other Findings   Reproductive/Obstetrics                              Anesthesia Physical Anesthesia Plan  ASA: 2  Anesthesia Plan: General   Post-op Pain Management: Tylenol PO (pre-op)*   Induction: Intravenous  PONV Risk Score and Plan: 4 or greater and Ondansetron, Dexamethasone, Treatment may vary due to age or medical condition and Propofol infusion  Airway Management Planned: Oral ETT  Additional Equipment: None  Intra-op Plan:   Post-operative Plan: Extubation in OR  Informed Consent: I have reviewed the patients History and Physical, chart, labs and discussed the procedure including the risks, benefits and alternatives for the proposed anesthesia with the patient or authorized representative who has indicated his/her understanding and acceptance.     Dental advisory given  Plan Discussed with: CRNA and Surgeon  Anesthesia Plan Comments:          Anesthesia Quick Evaluation

## 2024-01-15 NOTE — Telephone Encounter (Signed)
Telephone call to check on pre-operative status.  Patient compliant with pre-operative instructions.  Reinforced nothing to eat after midnight. Clear liquids until 4:15am. Patient to arrive at 5:15am. Verified that post-op medications have been sent to patient's preferred pharmacy.  No questions or concerns voiced.  Instructed to call for any needs. 

## 2024-01-16 ENCOUNTER — Ambulatory Visit (HOSPITAL_COMMUNITY)
Admission: RE | Admit: 2024-01-16 | Discharge: 2024-01-16 | Disposition: A | Discharge status: Home / Self Care | Payer: PPO | Source: Ambulatory Visit | Attending: Psychiatry | Admitting: Psychiatry

## 2024-01-16 ENCOUNTER — Encounter (HOSPITAL_COMMUNITY)
Admission: RE | Disposition: A | Discharge status: Home / Self Care | Payer: Self-pay | Source: Ambulatory Visit | Attending: Psychiatry

## 2024-01-16 ENCOUNTER — Other Ambulatory Visit: Payer: Self-pay

## 2024-01-16 ENCOUNTER — Encounter (HOSPITAL_COMMUNITY): Payer: Self-pay | Admitting: Psychiatry

## 2024-01-16 ENCOUNTER — Ambulatory Visit (HOSPITAL_BASED_OUTPATIENT_CLINIC_OR_DEPARTMENT_OTHER): Payer: Self-pay | Admitting: Anesthesiology

## 2024-01-16 ENCOUNTER — Ambulatory Visit (HOSPITAL_COMMUNITY): Payer: PPO | Admitting: Physician Assistant

## 2024-01-16 DIAGNOSIS — Z79899 Other long term (current) drug therapy: Secondary | ICD-10-CM | POA: Insufficient documentation

## 2024-01-16 DIAGNOSIS — N83201 Unspecified ovarian cyst, right side: Secondary | ICD-10-CM | POA: Insufficient documentation

## 2024-01-16 DIAGNOSIS — Z9851 Tubal ligation status: Secondary | ICD-10-CM | POA: Diagnosis not present

## 2024-01-16 DIAGNOSIS — N838 Other noninflammatory disorders of ovary, fallopian tube and broad ligament: Secondary | ICD-10-CM | POA: Insufficient documentation

## 2024-01-16 DIAGNOSIS — K219 Gastro-esophageal reflux disease without esophagitis: Secondary | ICD-10-CM | POA: Diagnosis not present

## 2024-01-16 DIAGNOSIS — K660 Peritoneal adhesions (postprocedural) (postinfection): Secondary | ICD-10-CM | POA: Diagnosis not present

## 2024-01-16 DIAGNOSIS — D398 Neoplasm of uncertain behavior of other specified female genital organs: Secondary | ICD-10-CM

## 2024-01-16 DIAGNOSIS — N7011 Chronic salpingitis: Secondary | ICD-10-CM | POA: Diagnosis not present

## 2024-01-16 DIAGNOSIS — R1900 Intra-abdominal and pelvic swelling, mass and lump, unspecified site: Secondary | ICD-10-CM

## 2024-01-16 DIAGNOSIS — R9389 Abnormal findings on diagnostic imaging of other specified body structures: Secondary | ICD-10-CM | POA: Insufficient documentation

## 2024-01-16 DIAGNOSIS — N736 Female pelvic peritoneal adhesions (postinfective): Secondary | ICD-10-CM | POA: Diagnosis not present

## 2024-01-16 DIAGNOSIS — D270 Benign neoplasm of right ovary: Secondary | ICD-10-CM | POA: Diagnosis not present

## 2024-01-16 DIAGNOSIS — R8769 Abnormal cytological findings in specimens from other female genital organs: Secondary | ICD-10-CM | POA: Diagnosis not present

## 2024-01-16 HISTORY — PX: ROBOTIC ASSISTED SALPINGO OOPHERECTOMY: SHX6082

## 2024-01-16 LAB — TYPE AND SCREEN
ABO/RH(D): O POS
Antibody Screen: NEGATIVE

## 2024-01-16 SURGERY — SALPINGO-OOPHORECTOMY, ROBOT-ASSISTED
Anesthesia: General | Laterality: Bilateral

## 2024-01-16 MED ORDER — ACETAMINOPHEN 500 MG PO TABS
1000.0000 mg | ORAL_TABLET | Freq: Once | ORAL | Status: AC
  Administered 2024-01-16: 1000 mg via ORAL

## 2024-01-16 MED ORDER — STERILE WATER FOR INJECTION IJ SOLN
INTRAMUSCULAR | Status: AC
  Filled 2024-01-16: qty 10

## 2024-01-16 MED ORDER — PROPOFOL 10 MG/ML IV BOLUS
INTRAVENOUS | Status: DC | PRN
  Administered 2024-01-16: 100 mg via INTRAVENOUS

## 2024-01-16 MED ORDER — HYDROMORPHONE HCL 1 MG/ML IJ SOLN: 0.2500 mg | INTRAMUSCULAR | Status: DC | PRN

## 2024-01-16 MED ORDER — MEPERIDINE HCL 50 MG/ML IJ SOLN
6.2500 mg | INTRAMUSCULAR | Status: DC | PRN
Start: 2024-01-16 — End: 2024-01-16

## 2024-01-16 MED ORDER — PROPOFOL 10 MG/ML IV BOLUS
INTRAVENOUS | Status: AC
  Filled 2024-01-16: qty 20

## 2024-01-16 MED ORDER — FENTANYL CITRATE (PF) 100 MCG/2ML IJ SOLN
INTRAMUSCULAR | Status: AC
  Filled 2024-01-16: qty 2

## 2024-01-16 MED ORDER — ONDANSETRON HCL 4 MG/2ML IJ SOLN
INTRAMUSCULAR | Status: DC | PRN
Start: 2024-01-16 — End: 2024-01-16
  Administered 2024-01-16: 4 mg via INTRAVENOUS

## 2024-01-16 MED ORDER — BUPIVACAINE LIPOSOME 1.3 % IJ SUSP
INTRAMUSCULAR | Status: AC
  Filled 2024-01-16: qty 20

## 2024-01-16 MED ORDER — CHLORHEXIDINE GLUCONATE 0.12 % MT SOLN
15.0000 mL | Freq: Once | OROMUCOSAL | Status: AC
  Administered 2024-01-16: 15 mL via OROMUCOSAL

## 2024-01-16 MED ORDER — ACETAMINOPHEN 500 MG PO TABS
1000.0000 mg | ORAL_TABLET | ORAL | Status: DC
  Filled 2024-01-16: qty 2

## 2024-01-16 MED ORDER — OXYCODONE HCL 5 MG/5ML PO SOLN: 5.0000 mg | Freq: Once | ORAL | Status: DC | PRN

## 2024-01-16 MED ORDER — LIDOCAINE HCL (CARDIAC) PF 100 MG/5ML IV SOSY
PREFILLED_SYRINGE | INTRAVENOUS | Status: DC | PRN
  Administered 2024-01-16: 100 mg via INTRAVENOUS

## 2024-01-16 MED ORDER — OXYCODONE HCL 5 MG PO TABS: 5.0000 mg | ORAL_TABLET | Freq: Once | ORAL | Status: DC | PRN

## 2024-01-16 MED ORDER — BUPIVACAINE HCL 0.25 % IJ SOLN
INTRAMUSCULAR | Status: AC
  Filled 2024-01-16: qty 1

## 2024-01-16 MED ORDER — HEPARIN SODIUM (PORCINE) 5000 UNIT/ML IJ SOLN
5000.0000 [IU] | INTRAMUSCULAR | Status: AC
  Administered 2024-01-16: 5000 [IU] via SUBCUTANEOUS
  Filled 2024-01-16: qty 1

## 2024-01-16 MED ORDER — DEXAMETHASONE SODIUM PHOSPHATE 4 MG/ML IJ SOLN
4.0000 mg | INTRAMUSCULAR | Status: AC
  Administered 2024-01-16: 4 mg via INTRAVENOUS

## 2024-01-16 MED ORDER — LACTATED RINGERS IV SOLN: INTRAVENOUS | Status: DC

## 2024-01-16 MED ORDER — ORAL CARE MOUTH RINSE: 15.0000 mL | Freq: Once | OROMUCOSAL | Status: AC

## 2024-01-16 MED ORDER — ROCURONIUM BROMIDE 100 MG/10ML IV SOLN
INTRAVENOUS | Status: DC | PRN
  Administered 2024-01-16: 60 mg via INTRAVENOUS
  Administered 2024-01-16: 10 mg via INTRAVENOUS

## 2024-01-16 MED ORDER — FENTANYL CITRATE (PF) 100 MCG/2ML IJ SOLN
INTRAMUSCULAR | Status: DC | PRN
  Administered 2024-01-16 (×2): 50 ug via INTRAVENOUS

## 2024-01-16 MED ORDER — PROPOFOL 500 MG/50ML IV EMUL
INTRAVENOUS | Status: DC | PRN
  Administered 2024-01-16: 50 ug/kg/min via INTRAVENOUS

## 2024-01-16 MED ORDER — MIDAZOLAM HCL 2 MG/2ML IJ SOLN: 0.5000 mg | Freq: Once | INTRAMUSCULAR | Status: DC | PRN

## 2024-01-16 MED ORDER — SUGAMMADEX SODIUM 200 MG/2ML IV SOLN
INTRAVENOUS | Status: DC | PRN
  Administered 2024-01-16: 200 mg via INTRAVENOUS

## 2024-01-16 MED ORDER — BUPIVACAINE HCL 0.25 % IJ SOLN
INTRAMUSCULAR | Status: DC | PRN
  Administered 2024-01-16: 18 mL

## 2024-01-16 MED ORDER — STERILE WATER FOR IRRIGATION IR SOLN
Status: DC | PRN
  Administered 2024-01-16: 1000 mL

## 2024-01-16 MED ORDER — BUPIVACAINE LIPOSOME 1.3 % IJ SUSP
INTRAMUSCULAR | Status: DC | PRN
Start: 2024-01-16 — End: 2024-01-16
  Administered 2024-01-16: 20 mL

## 2024-01-16 MED ORDER — LACTATED RINGERS IR SOLN
Status: DC | PRN
  Administered 2024-01-16: 1000 mL

## 2024-01-16 SURGICAL SUPPLY — 70 items
APPLICATOR SURGIFLO ENDO (HEMOSTASIS) IMPLANT
BAG LAPAROSCOPIC 12 15 PORT 16 (BASKET) IMPLANT
BAG RETRIEVAL 12/15 (BASKET)
BLADE SURG SZ10 CARB STEEL (BLADE) IMPLANT
COVER BACK TABLE 60X90IN (DRAPES) ×1 IMPLANT
COVER TIP SHEARS 8 DVNC (MISCELLANEOUS) ×1 IMPLANT
DERMABOND ADVANCED .7 DNX12 (GAUZE/BANDAGES/DRESSINGS) ×1 IMPLANT
DRAPE ARM DVNC X/XI (DISPOSABLE) ×4 IMPLANT
DRAPE COLUMN DVNC XI (DISPOSABLE) ×1 IMPLANT
DRAPE SHEET LG 3/4 BI-LAMINATE (DRAPES) ×1 IMPLANT
DRAPE SURG IRRIG POUCH 19X23 (DRAPES) ×1 IMPLANT
DRIVER NDL MEGA SUTCUT DVNCXI (INSTRUMENTS) ×1 IMPLANT
DRIVER NDLE MEGA SUTCUT DVNCXI (INSTRUMENTS)
DRSG OPSITE POSTOP 4X6 (GAUZE/BANDAGES/DRESSINGS) IMPLANT
DRSG OPSITE POSTOP 4X8 (GAUZE/BANDAGES/DRESSINGS) IMPLANT
ELECT PENCIL ROCKER SW 15FT (MISCELLANEOUS) IMPLANT
ELECT REM PT RETURN 15FT ADLT (MISCELLANEOUS) ×1 IMPLANT
FORCEPS BPLR FENES DVNC XI (FORCEP) ×1 IMPLANT
FORCEPS PROGRASP DVNC XI (FORCEP) ×1 IMPLANT
GAUZE 4X4 16PLY ~~LOC~~+RFID DBL (SPONGE) ×1 IMPLANT
GLOVE BIO SURGEON STRL SZ 6 (GLOVE) ×4 IMPLANT
GLOVE BIO SURGEON STRL SZ 6.5 (GLOVE) ×1 IMPLANT
GLOVE BIOGEL PI IND STRL 6.5 (GLOVE) ×2 IMPLANT
GOWN STRL REUS W/ TWL LRG LVL3 (GOWN DISPOSABLE) ×4 IMPLANT
GRASPER SUT TROCAR 14GX15 (MISCELLANEOUS) IMPLANT
HOLDER FOLEY CATH W/STRAP (MISCELLANEOUS) IMPLANT
IRRIG SUCT STRYKERFLOW 2 WTIP (MISCELLANEOUS) ×1
IRRIGATION SUCT STRKRFLW 2 WTP (MISCELLANEOUS) ×1 IMPLANT
KIT PROCEDURE DVNC SI (MISCELLANEOUS) IMPLANT
KIT TURNOVER KIT A (KITS) IMPLANT
LIGASURE IMPACT 36 18CM CVD LR (INSTRUMENTS) IMPLANT
MANIPULATOR ADVINCU DEL 3.0 PL (MISCELLANEOUS) IMPLANT
MANIPULATOR ADVINCU DEL 3.5 PL (MISCELLANEOUS) IMPLANT
MANIPULATOR UTERINE 4.5 ZUMI (MISCELLANEOUS) IMPLANT
NDL HYPO 21X1.5 SAFETY (NEEDLE) ×1 IMPLANT
NDL INSUFFLATION 14GA 120MM (NEEDLE) IMPLANT
NDL SPNL 20GX3.5 QUINCKE YW (NEEDLE) IMPLANT
NEEDLE HYPO 21X1.5 SAFETY (NEEDLE) ×1
NEEDLE INSUFFLATION 14GA 120MM (NEEDLE) ×1
NEEDLE SPNL 20GX3.5 QUINCKE YW (NEEDLE)
OBTURATOR OPTICAL STND 8 DVNC (TROCAR) ×1
OBTURATOR OPTICALSTD 8 DVNC (TROCAR) ×1 IMPLANT
PACK ROBOT GYN CUSTOM WL (TRAY / TRAY PROCEDURE) ×1 IMPLANT
PAD ARMBOARD 7.5X6 YLW CONV (MISCELLANEOUS) ×1 IMPLANT
PAD POSITIONING PINK XL (MISCELLANEOUS) ×1 IMPLANT
PORT ACCESS TROCAR AIRSEAL 12 (TROCAR) IMPLANT
SCISSORS MNPLR CVD DVNC XI (INSTRUMENTS) ×1 IMPLANT
SCRUB CHG 4% DYNA-HEX 4OZ (MISCELLANEOUS) ×2 IMPLANT
SEAL UNIV 5-12 XI (MISCELLANEOUS) ×4 IMPLANT
SET TRI-LUMEN FLTR TB AIRSEAL (TUBING) ×1 IMPLANT
SPIKE FLUID TRANSFER (MISCELLANEOUS) ×1 IMPLANT
SPONGE T-LAP 18X18 ~~LOC~~+RFID (SPONGE) IMPLANT
SURGIFLO W/THROMBIN 8M KIT (HEMOSTASIS) IMPLANT
SUT MNCRL AB 4-0 PS2 18 (SUTURE) IMPLANT
SUT PDS AB 1 TP1 54 (SUTURE) IMPLANT
SUT VIC AB 0 CT1 27XBRD ANTBC (SUTURE) IMPLANT
SUT VIC AB 2-0 CT1 TAPERPNT 27 (SUTURE) IMPLANT
SUT VIC AB 4-0 PS2 18 (SUTURE) ×2 IMPLANT
SUT VICRYL 0 27 CT2 27 ABS (SUTURE) ×1 IMPLANT
SYR 10ML LL (SYRINGE) IMPLANT
SYS BAG RETRIEVAL 10MM (BASKET) ×2
SYS WOUND ALEXIS 18CM MED (MISCELLANEOUS)
SYSTEM BAG RETRIEVAL 10MM (BASKET) IMPLANT
SYSTEM WOUND ALEXIS 18CM MED (MISCELLANEOUS) IMPLANT
TRAP SPECIMEN MUCUS 40CC (MISCELLANEOUS) IMPLANT
TRAY FOLEY MTR SLVR 16FR STAT (SET/KITS/TRAYS/PACK) ×1 IMPLANT
TROCAR PORT AIRSEAL 5X120 (TROCAR) IMPLANT
UNDERPAD 30X36 HEAVY ABSORB (UNDERPADS AND DIAPERS) ×2 IMPLANT
WATER STERILE IRR 1000ML POUR (IV SOLUTION) ×1 IMPLANT
YANKAUER SUCT BULB TIP 10FT TU (MISCELLANEOUS) IMPLANT

## 2024-01-16 NOTE — Discharge Instructions (Addendum)
AFTER SURGERY INSTRUCTIONS   Return to work: 4-6 weeks if applicable   Activity: 1. Be up and out of the bed during the day.  Take a nap if needed.  You may walk up steps but be careful and use the hand rail.  Stair climbing will tire you more than you think, you may need to stop part way and rest.    2. No lifting or straining for 6 weeks over 10 pounds. No pushing, pulling, straining for 6 weeks.   3. No driving for 4-09 days when the following criteria have been met: Do not drive if you are taking narcotic pain medicine and make sure that your reaction time has returned.    4. You can shower as soon as the next day after surgery. Shower daily.  Use your regular soap and water (not directly on the incision) and pat your incision(s) dry afterwards; don't rub.  No tub baths or submerging your body in water until cleared by your surgeon. If you have the soap that was given to you by pre-surgical testing that was used before surgery, you do not need to use it afterwards because this can irritate your incisions.    5. No sexual activity and nothing in the vagina for 4-6 weeks, 12 weeks if you have a hysterectomy (removal of the uterus and cervix).   6. You may experience a small amount of clear drainage from your incisions, which is normal.  If the drainage persists, increases, or changes color please call the office.   7. Do not use creams, lotions, or ointments such as neosporin on your incisions after surgery until advised by your surgeon because they can cause removal of the dermabond glue on your incisions.     8. You may experience vaginal spotting after surgery or when the stitches at the top of the vagina begin to dissolve (if you have a hysterectomy).  The spotting is normal but if you experience heavy bleeding, call our office.   9. Take Tylenol first for pain if you are able to take these medication and only use narcotic pain medication for severe pain not relieved by the Tylenol.   Monitor your Tylenol intake to a max of 4,000 mg in a 24 hour period.    Diet: 1. Low sodium Heart Healthy Diet is recommended but you are cleared to resume your normal (before surgery) diet after your procedure.   2. It is safe to use a laxative, such as Miralax or Colace, if you have difficulty moving your bowels before surgery. You have been prescribed Sennakot-S to take at bedtime every evening after surgery to keep bowel movements regular and to prevent constipation.     Wound Care: 1. Keep clean and dry.  Shower daily.   Reasons to call the Doctor: Fever - Oral temperature greater than 100.4 degrees Fahrenheit Foul-smelling vaginal discharge Difficulty urinating Nausea and vomiting Increased pain at the site of the incision that is unrelieved with pain medicine. Difficulty breathing with or without chest pain New calf pain especially if only on one side Sudden, continuing increased vaginal bleeding with or without clots.   Contacts: For questions or concerns you should contact:   Dr. Clide Cliff at [phone]   Warner Mccreedy, NP at [phone]   After Hours: call [phone] and have the GYN Oncologist paged/contacted (after 5 pm or on the weekends). You will speak with an after hours RN and let he or she know you have had surgery.  Messages sent via mychart are for non-urgent matters and are not responded to after hours so for urgent needs, please call the after hours number.

## 2024-01-16 NOTE — Anesthesia Postprocedure Evaluation (Signed)
Anesthesia Post Note  Patient: Catherine Stone  Procedure(s) Performed: XI ROBOTIC ASSISTED BILATERAL SALPINGO OOPHORECTOMY (Bilateral)     Patient location during evaluation: PACU Anesthesia Type: General Level of consciousness: awake and alert, oriented and patient cooperative Pain management: pain level controlled Vital Signs Assessment: post-procedure vital signs reviewed and stable Respiratory status: spontaneous breathing, nonlabored ventilation and respiratory function stable Cardiovascular status: blood pressure returned to baseline and stable Postop Assessment: no apparent nausea or vomiting Anesthetic complications: no   No notable events documented.  Last Vitals:  Vitals:   01/16/24 1015 01/16/24 1022  BP: 116/74 112/67  Pulse: 72 70  Resp: 15 15  Temp: (!) 36.4 C (!) 36.4 C  SpO2: 99% 93%    Last Pain:  Vitals:   01/16/24 1022  TempSrc:   PainSc: 6                  Eutha Cude,E. Shayanne Gomm

## 2024-01-16 NOTE — Anesthesia Procedure Notes (Signed)
Procedure Name: Intubation Date/Time: 01/16/2024 7:51 AM  Performed by: Sandie Ano, CRNAPre-anesthesia Checklist: Patient identified, Emergency Drugs available, Suction available and Patient being monitored Patient Re-evaluated:Patient Re-evaluated prior to induction Oxygen Delivery Method: Circle System Utilized Preoxygenation: Pre-oxygenation with 100% oxygen Induction Type: IV induction Ventilation: Mask ventilation without difficulty Laryngoscope Size: Mac and 3 Grade View: Grade I Tube type: Oral Tube size: 7.0 mm Number of attempts: 1 Airway Equipment and Method: Stylet and Oral airway Placement Confirmation: ETT inserted through vocal cords under direct vision, positive ETCO2 and breath sounds checked- equal and bilateral Secured at: 23 cm Tube secured with: Tape Dental Injury: Teeth and Oropharynx as per pre-operative assessment

## 2024-01-16 NOTE — Transfer of Care (Signed)
Immediate Anesthesia Transfer of Care Note  Patient: Catherine Stone  Procedure(s) Performed: XI ROBOTIC ASSISTED BILATERAL SALPINGO OOPHORECTOMY (Bilateral)  Patient Location: PACU  Anesthesia Type:General  Level of Consciousness: awake, alert , and oriented  Airway & Oxygen Therapy: Patient Spontanous Breathing  Post-op Assessment: Report given to RN and Post -op Vital signs reviewed and stable  Post vital signs: Reviewed and stable  Last Vitals:  Vitals Value Taken Time  BP 127/71 01/16/24 0939  Temp    Pulse 91 01/16/24 0944  Resp 17 01/16/24 0944  SpO2 100 % 01/16/24 0944  Vitals shown include unfiled device data.  Last Pain:  Vitals:   01/16/24 0641  TempSrc: Oral  PainSc:          Complications: No notable events documented.

## 2024-01-16 NOTE — H&P (Signed)
Brief Pre-operative History & Physical  Patient name: Catherine Stone CSN: [id number] MRN: [id number] Admit Date: 01/16/2024 Date of Surgery: 01/16/2024 Performing Service: Gynecology   Code Status: Full Code    Assessment & Plan    Catherine Stone is a 78 y.o. female with PELVIC MASS,THICKENED ENDOMETRIUM, who presents for: Procedure(s) (LRB): XI ROBOTIC ASSISTED BILATERAL SALPINGO OOPHORECTOMY, possible robotic assisted total hysterectomy, possible staging (Bilateral).   Consent obtained in office is accurate. Risks, benefits, and alternatives to surgery were reviewed, and all questions were answered.  Proceed to the OR as planned.     History of Present Illness:  Catherine Stone is a 78 y.o. female with PELVIC MASS,THICKENED ENDOMETRIUM. She was recently seen in clinic, where a detailed HPI can be found. She was noted to benefit from: Procedure(s) (LRB): XI ROBOTIC ASSISTED BILATERAL SALPINGO OOPHORECTOMY, possible robotic assisted total hysterectomy, possible staging (Bilateral).   Medical History Past Medical History:  Diagnosis Date   Anxiety    Depression    Family history of colon cancer    GERD (gastroesophageal reflux disease)    Hepatitis    inactive hep B   History of colon polyps    History of hepatitis B    Hypercholesterolemia    PONV (postoperative nausea and vomiting)    Surgical History Past Surgical History:  Procedure Laterality Date   COLONOSCOPY  09/23/2009   Colonic polyps status post polpectomy. Mild sigmoid diverticulosis. Small hemorrhoids.   ESOPHAGOGASTRODUODENOSCOPY  04/07/2020   Dr Jennye Boroughs. Possible early gastric antral vascular ectatic lesions. Evidence of prior fundoplication. Small hiatal hernia. Empiric esophageal dilation to 50 [place]   KNEE ARTHROSCOPY     LARYNGOPLASTY Bilateral    NISSEN FUNDOPLICATION  1995   ROTATOR CUFF REPAIR Right 2006   x 2   TUBAL LIGATION     Allergies Latex and Tape  Medications   Current  Facility-Administered Medications  Medication Dose Route Frequency Provider Last Rate Last Admin   acetaminophen (TYLENOL) tablet 1,000 mg  1,000 mg Oral On Call to OR Cross, Melissa D, NP       dexamethasone (DECADRON) injection 4 mg  4 mg Intravenous On Call to OR Cross, Efraim Kaufmann D, NP       lactated ringers infusion   Intravenous Continuous Trevor Iha, MD 10 mL/hr at 01/16/24 0628 New Bag at 01/16/24 7829    Vital Signs BP 123/68   Pulse 76   Temp 98.1 F (36.7 C) (Oral)   Resp 16   Ht 5\' 4"  (1.626 m)   Wt 114 lb (51.7 kg)   SpO2 94%   BMI 19.57 kg/m  Facility age limit for growth %iles is 20 years. Facility age limit for growth %iles is 20 years..   Physical Exam General: Well developed, appears stated age, in no acute distress  Mental status: Alert and oriented x3 Cardiovascular: Normal Pulmonary: Symmetric chest rise, unlabored breathing Relevant System for Surgery: Surgical site examination deferred to the OR   Labs and Studies: Lab Results  Component Value Date   WBC 8.6 12/21/2023   HGB 13.3 12/21/2023   HCT 42.2 12/21/2023   PLT 229 12/21/2023    No results found for: "INR", "APTT" \

## 2024-01-16 NOTE — Op Note (Addendum)
GYNECOLOGIC ONCOLOGY OPERATIVE NOTE  Date of Service: 01/16/2024  Preoperative Diagnosis: Complex adnexal mass  Postoperative Diagnosis: Right ovarian cyst and paratubal cyst, bilateral hydrosalpinx  Procedures: Robotic assisted bilateral salpingo-oophorectomy  Surgeon: Clide Cliff, MD  Assistants: Antionette Char, MD and (an MD assistant was necessary for tissue manipulation, management of robotic instrumentation, retraction and positioning due to the complexity of the case and hospital policies)  Anesthesia: General  Estimated Blood Loss: 10 mL    Fluids: 900 ml, crystalloid  Urine Output: 50 ml, clear yellow  Findings: On entry to abdomen, upper abdominal survey with normal right diaphragm. Scarring of right liver edge. Left upper abdomen with limited view to the left diaphragm. Filmy adhesions of the stomach to the anterior abdominal wall. No area of concern correlating to the patient's location of left upper abdominal wall pain. Omentum with few apparent areas of white scarring. In the pelvis, normal appearing small uterus. Adhesions of the cecum and appendix to the right lower quadrant. Adhesions of the sigmoid colon to the left pelvic sidewall. Evidence of prior bilateral tubal ligation. Clubbing and dilation of fimbriated end of left fallopian tube, otherwise normal appearing postmenopausal left ovary. Enlarged right ovary with cyst and paratubal cyst, hydrosalpinx. Intraoperative pathologic gross review normal, frozen deferred by pathology.   Specimens:  ID Type Source Tests Collected by Time Destination  1 : Left Tube and Ovary Tissue PATH Gyn tumor resection SURGICAL PATHOLOGY Clide Cliff, MD 01/16/2024 [phone]   2 : Right Tube and Ovary Tissue PATH Gyn tumor resection SURGICAL PATHOLOGY Clide Cliff, MD 01/16/2024 [phone]   A : Pelvic Washing Body Fluid PATH Cytology Pelvic Washing CYTOLOGY - NON PAP Clide Cliff, MD 01/16/2024 [phone]     Complications:   None  Indications for Procedure: Catherine Stone is a 78 y.o. woman with a complex adnexal mass with normal tumor markers.  Prior to the procedure, all risks, benefits, and alternatives were discussed and informed surgical consent was signed.  Procedure: Patient was taken to the operating room where general anesthesia was achieved.  She was positioned in dorsal lithotomy and prepped and draped.  A foley catheter was inserted into the bladder.  A hulka manipulator was secured in the cervix.    A 12 mm incision was made in the left upper quadrant near Palmer's point. A Veress needle was inserted in the abdomen and intra-abdominal placement was verified by a low opening pressure.  The abdomen was insufflated with CO2 gas.  The Veress needle was removed and the abdomen was entered with a 5 mm OptiView trocar under direct visualization. The abdomen was surveyed with the findings as noted above. Additional trocars were placed as follows: an 8 mm trocar in the midline above the umbilicus, one 8 mm robotic trocar in the right abdomen, and one 8 mm robotic trocar in the left abdomen.  The left upper quadrant trocar was removed and replaced with a 12 mm airseal trocar.  All trocars were placed under direct visualization. The patient placed in steep Trendelenburg, and the bowels were moved into the upper abdomen. The DaVinci robotic surgical system was brought to the patient's bedside and docked.  The peritoneum overlying the right pelvic sidewall was incised and the right retroperitoneum entered.  The right ureter was identified. Adhesions of the appendix and the cecum to the right lower quadrant were lateral to the area of dissection. The right infundibulopelvic ligament was isolated, cauterized, and transected.  The broad ligament was incised to the uterine  cornu.  The utero-ovarian ligament and the proximal fallopian tube were isolated, cauterized, and transected.  The same procedure was performed on the  contralateral side with adhesions of the sigmoid colon to the left pelvic sidewall lysed sharply with scissors.  Both specimens were placed into separate Endo-Catch bags and removed through the 12 mm trocar. Pathology returned as above.  The pelvis was irrigated and all operative sites were found to be hemostatic.  All instruments were removed and the robot was taken from the patient's bedside. The fascia at the 12 mm incision was closed with 0 Vicryl with the assistance of a PMI device. The abdomen was desufflated and all ports were removed. Exparel was injected at all incisions in standard fashion. The skin at all incisions was closed with 4-0 Vicryl to reapproximate the subcutaneous tissue and 4-0 monocryl in a subcuticular fashion followed by surgical glue.  Patient tolerated the procedure well. Sponge, lap, and instrument counts were correct.  No perioperative antibiotic prophylaxis was indicated for this procedure.  She was extubated and taken to the PACU in stable condition.  Clide Cliff, MD Gynecologic Oncology

## 2024-01-17 ENCOUNTER — Encounter (HOSPITAL_COMMUNITY): Payer: Self-pay | Admitting: Psychiatry

## 2024-01-17 ENCOUNTER — Telehealth: Payer: Self-pay | Admitting: *Deleted

## 2024-01-17 ENCOUNTER — Encounter: Payer: Self-pay | Admitting: Psychiatry

## 2024-01-17 LAB — SURGICAL PATHOLOGY

## 2024-01-17 LAB — CYTOLOGY - NON PAP

## 2024-01-17 NOTE — Telephone Encounter (Signed)
Patient called and stated "I had a procedure yesterday with Dr Alvester Morin and went home. Last night I started having severe burning with urination. It got so bad that I started using water while I urination. I did take 2 AZO tablets last night and 2 again this morning. That has seemed to help. Do I need to just continue to do that?"   Explained that the message will be given to the clinic nurse and the nurse would call her back. Patient stated understanding and requested to call her on her cell phone [phone]) as she was going to stay in bed and rest.

## 2024-01-17 NOTE — Telephone Encounter (Signed)
Spoke with Ms. Catherine Stone this morning. She states she is eating, drinking and experiencing some irritation and burning on urination. Pt advised to continue to keep herself well hydrated and this burning and irritation could be from the foley catheter that was inserted during surgery. This irritation can last up to 24 hours, but if it continues and you develop fever, chills, lower back back or bladder pain call the office.  Pt has had a BM and is passing gas. She is taking senokot as prescribed and encouraged her to drink plenty of water. She denies fever or chills. Incisions are dry and intact. She rates her pain 4/10. Her pain is controlled with tramadol.     Instructed to call office with any fever, chills, purulent drainage, uncontrolled pain or any other questions or concerns. Patient verbalizes understanding.   Pt aware of post op appointments as well as the office number [phone] and after hours number [phone] to call if she has any questions or concerns

## 2024-01-18 ENCOUNTER — Telehealth: Payer: Self-pay | Admitting: *Deleted

## 2024-01-18 ENCOUNTER — Encounter: Payer: Self-pay | Admitting: *Deleted

## 2024-01-18 NOTE — Telephone Encounter (Signed)
Spoke with patient and relayed message from Warner Mccreedy, NP that her blister areas sound like dermatitis from the adhesive of the sticky drapes during surgery. Catherine Stone advised she can use hydrocortisone cream on the areas. Catherine Stone verbalized understanding and thanked the office for calling.

## 2024-01-18 NOTE — Telephone Encounter (Signed)
Attempted to reach patient after she left a message for the office early this morning. Pt's husband state's that Allis is sleeping currently. Advised to have her call the office once she is awake.

## 2024-01-18 NOTE — Telephone Encounter (Signed)
Spoke with Catherine Stone. Pt states she has two areas just underneath the left breast and the right breast that are clusters of blisters the shape of a strip approximately 2-2.5" long. Pt believes she reacted to tape that was placed during her surgery. Pt states they are water blisters that are red and itchy. Advised patient she can try applying some benadryl ointment and her message would be relayed to providers.   Pt also states the burning and irritation she had on urination has resolved. Pt also read her MyChart message from Dr. Alvester Morin that her pathology from surgery is benign. Pt is happy to hear this good news. Catherine Stone was reminded of her follow up appt. With Dr. Alvester Morin on 3/3 at 1:45.

## 2024-01-22 ENCOUNTER — Telehealth: Payer: Self-pay | Admitting: Psychiatry

## 2024-01-22 ENCOUNTER — Telehealth: Payer: Self-pay | Admitting: Surgery

## 2024-01-22 NOTE — Telephone Encounter (Signed)
 Called patient to follow-up regarding her questions.  Patient wanted to know the purpose of the left upper quadrant incision.  This was used for entry with camera and then subsequently used as assistant trocar.  Reviewed that Dr. Dossie Der was able to come visualize the upper abdomen when we were doing her upper abdominal survey.  We did not find anything that would necessarily explain her left upper quadrant pain from inside the abdomen.  Reviewed with patient that she may have a musculoskeletal source of pain possibly.  At this time, she may have some exacerbation of pain due to the left upper quadrant incision with a fascial stitch.  Exparel was injected at the site but is likely worn off at this time. All questions answered. Follow-up next week.

## 2024-01-22 NOTE — Telephone Encounter (Signed)
 Patient called stating she wants to speak with Dr Alvester Morin. She became tearful on call and stated she wanted to know if the incision Dr Alvester Morin made below her L ribcage was to check for adhesions from her previous surgery. Patient stated she was told Dr Alvester Morin would call her today to discuss this with her. Advised patient that Dr Alvester Morin is seeing patients in clinic but that her request would be sent to Dr Alvester Morin and our office will follow up with her.

## 2024-01-25 DIAGNOSIS — R1012 Left upper quadrant pain: Secondary | ICD-10-CM | POA: Diagnosis not present

## 2024-01-25 DIAGNOSIS — Z90722 Acquired absence of ovaries, bilateral: Secondary | ICD-10-CM | POA: Diagnosis not present

## 2024-01-25 DIAGNOSIS — G8929 Other chronic pain: Secondary | ICD-10-CM | POA: Diagnosis not present

## 2024-01-25 DIAGNOSIS — Z6820 Body mass index (BMI) 20.0-20.9, adult: Secondary | ICD-10-CM | POA: Diagnosis not present

## 2024-01-29 ENCOUNTER — Inpatient Hospital Stay: Payer: PPO | Attending: Psychiatry | Admitting: Psychiatry

## 2024-01-29 ENCOUNTER — Encounter: Payer: Self-pay | Admitting: Psychiatry

## 2024-01-29 VITALS — BP 117/62 | HR 87 | Temp 98.0°F | Resp 18 | Wt 116.0 lb

## 2024-01-29 DIAGNOSIS — D398 Neoplasm of uncertain behavior of other specified female genital organs: Secondary | ICD-10-CM

## 2024-01-29 DIAGNOSIS — R1900 Intra-abdominal and pelvic swelling, mass and lump, unspecified site: Secondary | ICD-10-CM

## 2024-01-29 DIAGNOSIS — Z90722 Acquired absence of ovaries, bilateral: Secondary | ICD-10-CM

## 2024-01-29 NOTE — Progress Notes (Signed)
 Gynecologic Oncology Return Clinic Visit  Date of Service: 01/29/2024 Referring Provider: Lezlie Stone, Catherine Coffee, MD [address] [place],  [place] [address]  Assessment & Plan: Catherine Stone is a 78 y.o. woman with an adnexal mass who is s/p RA-BSO on 01/16/24, final pathology benign.  Postop: - Pt recovering well from surgery and healing appropriately postoperatively - Intraoperative findings and pathology results reviewed. Benign. - Reviewed that there was no finding at her area of pain in her left upper quadrant to explain the pain. Dr. Dossie Stone was able to view this area as well - Ongoing postoperative expectations reviewed. Restrictions lifted. - Okay to return to routine care.  RTC prn.  Catherine Cliff, MD Gynecologic Oncology   ----------------------- Reason for Visit: Postop   Interval History: Pt reports that she is recovering well from surgery. She is using tylenol for pain. She is eating and drinking well. She is voiding without issue and having regular bowel movements.  Reports coughing occasional mucus since surgery.  No fevers or chills.    Past Medical/Surgical History: Past Medical History:  Diagnosis Date   Anxiety    Depression    Family history of colon cancer    GERD (gastroesophageal reflux disease)    Hepatitis    inactive hep B   History of colon polyps    History of hepatitis B    Hypercholesterolemia    PONV (postoperative nausea and vomiting)     Past Surgical History:  Procedure Laterality Date   COLONOSCOPY  09/23/2009   Colonic polyps status post polpectomy. Mild sigmoid diverticulosis. Small hemorrhoids.   ESOPHAGOGASTRODUODENOSCOPY  04/07/2020   Dr Jennye Stone. Possible early gastric antral vascular ectatic lesions. Evidence of prior fundoplication. Small hiatal hernia. Empiric esophageal dilation to 50 [place]   KNEE ARTHROSCOPY     LARYNGOPLASTY Bilateral    NISSEN FUNDOPLICATION  1995   ROBOTIC ASSISTED  SALPINGO OOPHERECTOMY Bilateral 01/16/2024   Procedure: XI ROBOTIC ASSISTED BILATERAL SALPINGO OOPHORECTOMY;  Surgeon: Catherine Cliff, MD;  Location: [facility];  Service: Gynecology;  Laterality: Bilateral;   ROTATOR CUFF REPAIR Right 2006   x 2   TUBAL LIGATION      Family History  Problem Relation Age of Onset   Colon cancer Mother 55   Breast cancer Neg Hx    Ovarian cancer Neg Hx    Uterine cancer Neg Hx     Social History   Socioeconomic History   Marital status: Married    Spouse name: Catherine Stone   Number of children: 4   Years of education: Not on file   Highest education level: Not on file  Occupational History   Occupation: retired  Tobacco Use   Smoking status: Never   Smokeless tobacco: Never  Vaping Use   Vaping status: Never Used  Substance and Sexual Activity   Alcohol use: Never   Drug use: Never   Sexual activity: Yes    Partners: Male  Other Topics Concern   Not on file  Social History Narrative   Not on file   Social Drivers of Health   Financial Resource Strain: Not on file  Food Insecurity: No Food Insecurity (12/08/2023)   Hunger Vital Sign    Worried About Running Out of Food in the Last Year: Never true    Ran Out of Food in the Last Year: Never true  Transportation Needs: No Transportation Needs (12/08/2023)   PRAPARE - Administrator, Civil Service (Medical): No  Lack of Transportation (Non-Medical): No  Physical Activity: Not on file  Stress: Not on file  Social Connections: Not on file    Current Medications:  Current Outpatient Medications:    Azelastine HCl 137 MCG/SPRAY SOLN, Place 1 spray into the nose daily as needed (nasal congestion/allergies.)., Disp: , Rfl:    DULoxetine (CYMBALTA) 30 MG capsule, Take 30 mg by mouth every evening., Disp: , Rfl:    esomeprazole (NEXIUM) 40 MG capsule, Take 40 mg by mouth every evening., Disp: , Rfl:    fluticasone (FLONASE) 50 MCG/ACT nasal spray, Place 1 spray into both nostrils  daily as needed (nasal congestion/allergies.)., Disp: , Rfl:    Multiple Vitamins-Minerals (PRESERVISION AREDS) CAPS, Take 1 capsule by mouth in the morning., Disp: , Rfl:    pravastatin (PRAVACHOL) 20 MG tablet, Take 20 mg by mouth every morning., Disp: , Rfl:    pregabalin (LYRICA) 50 MG capsule, Take 1 capsule (50 mg total) by mouth 3 (three) times daily. (Patient taking differently: Take 50 mg by mouth every evening.), Disp: 90 capsule, Rfl: 2   promethazine (PHENERGAN) 12.5 MG tablet, Take 12.5 mg by mouth every 6 (six) hours as needed for nausea or vomiting., Disp: , Rfl:    senna-docusate (SENOKOT-S) 8.6-50 MG tablet, Take 2 tablets by mouth at bedtime. For AFTER surgery, do not take if having diarrhea, Disp: 30 tablet, Rfl: 0   temazepam (RESTORIL) 15 MG capsule, Take 15 mg by mouth at bedtime as needed for sleep., Disp: , Rfl:    tetrahydrozoline 0.05 % ophthalmic solution, Place 1-2 drops into both eyes in the morning., Disp: , Rfl:    zolpidem (AMBIEN) 5 MG tablet, Take 5 mg by mouth at bedtime., Disp: , Rfl:   Review of Symptoms: Complete 10-system review is positive for: Back pain, wound, abdominal pain, mucus Physical Exam: BP 117/62 (BP Location: Left Arm, Patient Position: Sitting)   Pulse 87   Temp 98 F (36.7 C) (Oral)   Resp 18   Wt 116 lb (52.6 kg)   SpO2 97%   BMI 19.91 kg/m  General: Alert, oriented, no acute distress. HEENT: Normocephalic, atraumatic. Neck symmetric without masses. Sclera anicteric.  Chest: Normal work of breathing. Clear to auscultation bilaterally.   Cardiovascular: Regular rate and rhythm, no murmurs. Abdomen: Soft, nontender.  Normoactive bowel sounds.  No masses appreciated.  Well-healing incisions with glue. Extremities: Grossly normal range of motion.  Warm, well perfused.  No edema bilaterally.   Laboratory & Radiologic Studies: Surgical pathology (01/16/24): A. LEFT OVARY AND FALLOPIAN TUBE, SALPINGO OOPHORECTOMY:  Benign paratubal  cysts  Benign ovary  Benign fallopian tube  Status post tubal ligation   B. RIGHT OVARY AND FALLOPIAN TUBE, SALPINGO OOPHORECTOMY:  Benign multiloculated serocystadenoma of ovary  Benign paratubal cysts  Benign fallopian tube

## 2024-01-29 NOTE — Patient Instructions (Signed)
 It was a pleasure to see you in clinic today. - Restrictions are lifted. Okay to resume normal activities. - Okay to resume routine care.    Thank you very much for allowing me to provide care for you today.  I appreciate your confidence in choosing our Gynecologic Oncology team at [facility].  If you have any questions about your visit today please call our office or send [place] a MyChart message and we will get back to you as soon as possible.

## 2024-02-06 ENCOUNTER — Ambulatory Visit: Payer: PPO | Admitting: Physical Medicine and Rehabilitation

## 2024-02-06 ENCOUNTER — Other Ambulatory Visit: Payer: Self-pay

## 2024-02-06 VITALS — BP 133/78 | HR 97

## 2024-02-06 DIAGNOSIS — M47814 Spondylosis without myelopathy or radiculopathy, thoracic region: Secondary | ICD-10-CM

## 2024-02-06 DIAGNOSIS — G8929 Other chronic pain: Secondary | ICD-10-CM

## 2024-02-06 DIAGNOSIS — M546 Pain in thoracic spine: Secondary | ICD-10-CM | POA: Diagnosis not present

## 2024-02-06 MED ORDER — METHYLPREDNISOLONE ACETATE 40 MG/ML IJ SUSP
40.0000 mg | Freq: Once | INTRAMUSCULAR | Status: AC
  Administered 2024-02-06: 40 mg

## 2024-02-06 NOTE — Progress Notes (Signed)
 Pain Scale   Average Pain 5        +Driver, -BT, -Dye Allergies.

## 2024-02-06 NOTE — Patient Instructions (Signed)

## 2024-02-14 ENCOUNTER — Ambulatory Visit: Payer: Self-pay | Admitting: Physical Therapy

## 2024-02-19 NOTE — Procedures (Signed)
 Thoracic Diagnostic Facet Joint Nerve Block with Fluoroscopic Guidance  Patient: Catherine Stone      Date of Birth: Oct 04, 1946 MRN: [id number] PCP: Lezlie Lye, Meda Coffee, MD      Visit Date: 02/06/2024   Universal Protocol:    Date/Time: 03/24/254:34 PM  Consent Given By: the patient  Position: PRONE   Additional Comments: Vital signs were monitored before and after the procedure. Patient was prepped and draped in the usual sterile fashion. The correct patient, procedure, and site was verified.   Injection Procedure Details:  Procedure Site One Meds Administered:  Meds ordered this encounter  Medications   methylPREDNISolone acetate (DEPO-MEDROL) injection 40 mg     Laterality: Bilateral  Location/Site:  T9-10  Needle size: 25 G  Needle type: spinal needle   Findings:    -Comments: Excellent flow contrast over the pedicle shadows without intravascular flow.  Procedure Details: The fluoroscope beam is vertically oriented and tilted cranially and caudally to square off the vertebral body endplates to achieve a true AP midline view.  The skin over the target area of the pedicle shadow ipsilateral to the desired medial branch nerve was locally anesthetized with a 1 ml volume of 1% Lidocaine without Epinephrine.  A 22 gauge spinal needle was inserted and advanced in a trajectory view down to the target.   After contact with periosteum and negative aspirate for blood and CSF, correct placement without intravascular or epidural spread was confirmed by injecting 0.5 ml. of Omnipaque-240.  A spot radiograph was obtained of this image.  Next, a 0.5 ml. volume of the injectate described above was injected. The needle was then redirected to the other facet joint nerves mentioned above if needed.  Prior to the procedure, the patient was given a Pain Diary which was completed for baseline measurements.  After the procedure, the patient rated their pain every 30 minutes and will  continue rating at this frequency for a total of 5 hours.  The patient has been asked to complete the Diary and return to [place] by mail, fax or hand delivered as soon as possible.   Additional Comments:  The patient tolerated the procedure well Dressing: Band-Aid    Post-procedure details: Patient was observed during the procedure. Post-procedure instructions were reviewed. Follow-Up Instructions: Given to the patient Patient left the clinic in stable condition.

## 2024-02-19 NOTE — Progress Notes (Signed)
 AZALEAH USMAN - 78 y.o. female MRN [id number]  Date of birth: [birth year]  Office Visit Note: Visit Date: 02/06/2024 PCP: Lezlie Lye, Meda Coffee, MD Referred by: Lezlie Lye, Meda Coffee, *  Subjective: Chief Complaint  Patient presents with   Lower Back - Pain   HPI:  LORRIE GARGAN is a 78 y.o. female who comes in today at the request of Dr. Willia Craze for planned Bilateral  T9-10 Thoracic facet/medial branch block with fluoroscopic guidance.  The patient has failed conservative care including home exercise, medications, time and activity modification.  This injection will be diagnostic and hopefully therapeutic.  Please see requesting physician notes for further details and justification.  Exam has shown concordant pain with facet joint loading.  Since we have seen her she did undergo surgery by Dr. Cl[facility] looking at mass but also looking at the abdomen.  Nothing was seen that would explain her abdominal pain.  She continues to have this thoracic and abdominal pain.  She is quite tearful today because nothing seems to help it.   ROS Otherwise per HPI.  Assessment & Plan: Visit Diagnoses:    ICD-10-CM   1. Chronic bilateral thoracic back pain  M54.6 XR C-ARM NO REPORT   G89.29 Facet Injection    methylPREDNISolone acetate (DEPO-MEDROL) injection 40 mg    2. Facet arthropathy, thoracic  M47.814 XR C-ARM NO REPORT    Facet Injection    methylPREDNISolone acetate (DEPO-MEDROL) injection 40 mg      Plan: No additional findings.   Meds & Orders:  Meds ordered this encounter  Medications   methylPREDNISolone acetate (DEPO-MEDROL) injection 40 mg    Orders Placed This Encounter  Procedures   Facet Injection   XR C-ARM NO REPORT    Follow-up: Return for visit to requesting provider as needed.   Procedures: No procedures performed  Thoracic Diagnostic Facet Joint Nerve Block with Fluoroscopic Guidance  Patient: Catherine Stone      Date of Birth:  [birth year] MRN: [id number] PCP: Lezlie Lye, Meda Coffee, MD      Visit Date: 02/06/2024   Universal Protocol:    Date/Time: 03/24/254:34 PM  Consent Given By: the patient  Position: PRONE   Additional Comments: Vital signs were monitored before and after the procedure. Patient was prepped and draped in the usual sterile fashion. The correct patient, procedure, and site was verified.   Injection Procedure Details:  Procedure Site One Meds Administered:  Meds ordered this encounter  Medications   methylPREDNISolone acetate (DEPO-MEDROL) injection 40 mg     Laterality: Bilateral  Location/Site:  T9-10  Needle size: 25 G  Needle type: spinal needle   Findings:    -Comments: Excellent flow contrast over the pedicle shadows without intravascular flow.  Procedure Details: The fluoroscope beam is vertically oriented and tilted cranially and caudally to square off the vertebral body endplates to achieve a true AP midline view.  The skin over the target area of the pedicle shadow ipsilateral to the desired medial branch nerve was locally anesthetized with a 1 ml volume of 1% Lidocaine without Epinephrine.  A 22 gauge spinal needle was inserted and advanced in a trajectory view down to the target.   After contact with periosteum and negative aspirate for blood and CSF, correct placement without intravascular or epidural spread was confirmed by injecting 0.5 ml. of Omnipaque-240.  A spot radiograph was obtained of this image.  Next, a 0.5 ml. volume of the injectate described  above was injected. The needle was then redirected to the other facet joint nerves mentioned above if needed.  Prior to the procedure, the patient was given a Pain Diary which was completed for baseline measurements.  After the procedure, the patient rated their pain every 30 minutes and will continue rating at this frequency for a total of 5 hours.  The patient has been asked to complete the Diary and return to [place]  by mail, fax or hand delivered as soon as possible.   Additional Comments:  The patient tolerated the procedure well Dressing: Band-Aid    Post-procedure details: Patient was observed during the procedure. Post-procedure instructions were reviewed. Follow-Up Instructions: Given to the patient Patient left the clinic in stable condition.       Clinical History: Narrative & Impression CLINICAL DATA:  78 year old female with mid back pain radiating around the sides. No known injury.   EXAM: MRI THORACIC WITHOUT AND WITH CONTRAST   TECHNIQUE: Multiplanar and multiecho pulse sequences of the thoracic spine were obtained without and with intravenous contrast.   CONTRAST:  5mL GADAVIST GADOBUTROL 1 MMOL/ML IV SOLN   COMPARISON:  [facility] CT Chest, Abdomen, and Pelvis 11/10/2022, thoracic radiographs 12/21/2022.   FINDINGS: Limited cervical spine imaging:  Age appropriate.   Thoracic spine segmentation:  Normal on the comparison CT.   Alignment: Stable thoracic kyphosis since last year. Slight dextroconvex thoracic scoliosis is stable. Subtle anterolisthesis of T2 on T3 and T3 on T4 is chronic, stable from the CT last year. And there is similar subtle anterolisthesis of T9 on T10, stable.   Vertebrae: Unknown artifact affecting the T9 thoracic level on sagittal sequences, not apparent on axial images. Background bone marrow signal within normal limits (occasional benign vertebral hemangiomas T10 and T11 on the right series 18, image 4). Maintained thoracic vertebral height. No convincing marrow edema or acute osseous abnormality.   Cord: Normal, with underlying capacious thoracic spinal canal. Conus medullaris appears normal at T12-L1. No abnormal intradural enhancement. No dural thickening.   Paraspinal and other soft tissues: Negative visible thoracic viscera.   Subcentimeter right thyroid nodule series 20, image 4. Not clinically significant; no  follow-up imaging recommended (ref: J Am Coll Radiol. 2015 Feb;12(2): 143-50).   Stable small hepatic dome cyst from the CT last year on series 20, image 34 (no follow-up imaging recommended). And otherwise negative visible abdominal viscera.   Negative thoracic paraspinal soft tissues.   Disc levels:   T1-T2: Circumferential disc bulge eccentric to the right. Mild to moderate facet and ligament flavum hypertrophy. No spinal or convincing foraminal stenosis.   T2-T3: Mild anterolisthesis. Mild disc bulge. Mild to moderate facet hypertrophy on the right. No spinal stenosis. Mild right T2 neural foraminal stenosis.   T3-T4: Subtle anterolisthesis. Mild facet hypertrophy on the right. No stenosis.   T4-T5: Negative disc. Mild to moderate facet hypertrophy on the right. Mild right T4 neural foraminal stenosis.   T5-T6: Negative.   T6-T7: Negative.   T7-T8: Small right paracentral disc bulge or protrusion (series 20, image 26). No stenosis.   T8-T9: Subtle disc bulging.  No stenosis.   T9-T10: Subtle anterolisthesis and disc bulging. Mild to moderate facet and ligament flavum hypertrophy. Trace degenerative facet joint fluid on the left (series 20, image 32). No stenosis.   T10-T11: Negative.   T11-T12: Circumferential disc bulge with a small right paracentral annular fissure (series 20, image 38). No stenosis.   T12-L1: Similar mild circumferential disc bulge and small right  paracentral annular fissure, with tiny cephalad disc protrusion there (series 20, image 41). No stenosis.   No visible upper lumbar spinal stenosis.   IMPRESSION: 1. No acute osseous abnormality in the Thoracic Spine. Mild chronic thoracic anterolisthesis at several levels, primarily with facet degeneration. And mild lower thoracic disc degeneration eccentric to the right at T11-T12 and T12-L1. 2. No thoracic spinal stenosis and normal thoracic spinal cord. Occasional mild thoracic neural  foraminal stenosis.     Electronically Signed   By: Odessa Fleming M.D.   On: 05/22/2023 08:13     Objective:  VS:  HT:    WT:   BMI:     BP:133/78  HR:97bpm  TEMP: ( )  RESP:  Physical Exam Vitals and nursing note reviewed.  Constitutional:      General: She is not in acute distress.    Appearance: Normal appearance. She is not ill-appearing.  HENT:     Head: Normocephalic and atraumatic.     Right Ear: External ear normal.     Left Ear: External ear normal.  Eyes:     Extraocular Movements: Extraocular movements intact.  Cardiovascular:     Rate and Rhythm: Normal rate.     Pulses: Normal pulses.  Pulmonary:     Effort: Pulmonary effort is normal. No respiratory distress.  Abdominal:     General: There is no distension.     Palpations: Abdomen is soft.  Musculoskeletal:        General: Tenderness present.     Cervical back: Neck supple.     Right lower leg: No edema.     Left lower leg: No edema.     Comments: Patient has good distal strength with no pain over the greater trochanters.  No clonus or focal weakness.  Skin:    Findings: No erythema, lesion or rash.  Neurological:     General: No focal deficit present.     Mental Status: She is alert and oriented to person, place, and time.     Sensory: No sensory deficit.     Motor: No weakness or abnormal muscle tone.     Coordination: Coordination normal.  Psychiatric:        Mood and Affect: Mood normal.        Behavior: Behavior normal.      Imaging: No results found.

## 2024-02-21 ENCOUNTER — Encounter: Payer: Self-pay | Admitting: Physical Therapy

## 2024-02-28 ENCOUNTER — Encounter: Payer: Self-pay | Admitting: Physical Therapy

## 2024-03-04 ENCOUNTER — Other Ambulatory Visit (HOSPITAL_BASED_OUTPATIENT_CLINIC_OR_DEPARTMENT_OTHER): Payer: PPO

## 2024-03-06 ENCOUNTER — Encounter: Payer: Self-pay | Admitting: Physical Therapy

## 2024-03-06 DIAGNOSIS — S90221S Contusion of right lesser toe(s) with damage to nail, sequela: Secondary | ICD-10-CM | POA: Diagnosis not present

## 2024-03-06 DIAGNOSIS — L603 Nail dystrophy: Secondary | ICD-10-CM | POA: Diagnosis not present

## 2024-03-08 DIAGNOSIS — J029 Acute pharyngitis, unspecified: Secondary | ICD-10-CM | POA: Diagnosis not present

## 2024-03-08 DIAGNOSIS — J302 Other seasonal allergic rhinitis: Secondary | ICD-10-CM | POA: Diagnosis not present

## 2024-03-08 DIAGNOSIS — R0981 Nasal congestion: Secondary | ICD-10-CM | POA: Diagnosis not present

## 2024-03-08 DIAGNOSIS — R0982 Postnasal drip: Secondary | ICD-10-CM | POA: Diagnosis not present

## 2024-03-27 DIAGNOSIS — G8929 Other chronic pain: Secondary | ICD-10-CM | POA: Diagnosis not present

## 2024-03-27 DIAGNOSIS — Z9889 Other specified postprocedural states: Secondary | ICD-10-CM | POA: Diagnosis not present

## 2024-03-27 DIAGNOSIS — R109 Unspecified abdominal pain: Secondary | ICD-10-CM | POA: Diagnosis not present

## 2024-04-25 DIAGNOSIS — G8929 Other chronic pain: Secondary | ICD-10-CM | POA: Diagnosis not present

## 2024-04-25 DIAGNOSIS — R1012 Left upper quadrant pain: Secondary | ICD-10-CM | POA: Diagnosis not present

## 2024-04-25 DIAGNOSIS — Z1331 Encounter for screening for depression: Secondary | ICD-10-CM | POA: Diagnosis not present

## 2024-04-25 DIAGNOSIS — K219 Gastro-esophageal reflux disease without esophagitis: Secondary | ICD-10-CM | POA: Diagnosis not present

## 2024-04-25 DIAGNOSIS — Z6820 Body mass index (BMI) 20.0-20.9, adult: Secondary | ICD-10-CM | POA: Diagnosis not present

## 2024-04-25 DIAGNOSIS — M47814 Spondylosis without myelopathy or radiculopathy, thoracic region: Secondary | ICD-10-CM | POA: Diagnosis not present

## 2024-05-07 DIAGNOSIS — G588 Other specified mononeuropathies: Secondary | ICD-10-CM | POA: Diagnosis not present

## 2024-05-14 DIAGNOSIS — G588 Other specified mononeuropathies: Secondary | ICD-10-CM | POA: Diagnosis not present

## 2024-05-17 DIAGNOSIS — F4323 Adjustment disorder with mixed anxiety and depressed mood: Secondary | ICD-10-CM | POA: Diagnosis not present

## 2024-05-17 DIAGNOSIS — Z6820 Body mass index (BMI) 20.0-20.9, adult: Secondary | ICD-10-CM | POA: Diagnosis not present

## 2024-05-17 DIAGNOSIS — G588 Other specified mononeuropathies: Secondary | ICD-10-CM | POA: Diagnosis not present

## 2024-05-20 NOTE — Progress Notes (Unsigned)
 05/21/2024 Catherine Stone [id number] [birth year]  Referring provider: Melonie Colonel, Mikel HERO, * Primary GI doctor: Dr. Charlanne  ASSESSMENT AND PLAN:   Chronic left upper abdominal pain Chronic for years, daily burning epigastric discomfort with some sharp hot poker pain under left shoulder blade Just had injections in her back to help pain but this did not help Occ GERD, nausea occ, no dysphagia/regurg 09/23/2009 colonoscopy 1 cm polyp s/p polypectomy. Bx-tubular adenoma   Per patient she had recent colonoscopy by Dr. Larene.  I do not see that report.  06/27/2023 CT abdomen pelvis with contrast interval progression of complex multicystic lesion right posterior pelvis 7 x 3.8 x 4.4 cm, no ascites Status post bilateral oophorectomy 01/16/2024 with Dr. Eldonna Patient continues to have pain, tearful with the discomfort, still sounds like MSK/adhesions/radicular however will evaluate for GI causea -repeat EGD at [facility] with Dr. Charlanne with symptoms of GERD continuing -will get GES since she has early satiety  -will get HIDA with pain radiating to shoulder blade.  -Increase nexium to 40 mg BID for a month, continue pepcid at night -Add on allginate therapy - trial of dicycolmine for pain -Continue follow up with ortho for possible radicular pain -If these test are unremarkable, will set up for AB injection in the office with Dr. San and may need pain management referral - consider testing for SIBO or trial of flagyl 250 mg TID for 10 days  GERD with remote history of nissan fundiplication 04/07/2020 EGD status post dilation at [facility] UGI series 07/2021 Intact Nissen fundoplication with no convincing evidence of a recurrent hiatal hernia. No gastroesophageal reflux elicited. Unremarkable upper GI with no evidence of masses, strictures or peptic ulcer disease. Have tried Protonix without help and Dexilant did help but was stopped Currently on nexium 40 mg at night and pepcid 20  mg at night, this has helped some - increase nexium to BID for a month - alginate therapy - will plan on EGD at [facility], I discussed risks of EGD with patient today, including risk of sedation, bleeding or perforation.  Patient provides understanding and gave verbal consent to proceed. -Get GES as this can be common after nissan fundiplication  IBS-C She is on miralax 1 capful every other day, occ daily No hematochezia, bowels controlled on miralax - get KUB - consider linzess trial 72 mcg  Personal history of colon polyps 09/23/2009 colonoscopy 1 cm polyp s/p polypectomy. Bx-tubular adenoma   Per patient she had recent colonoscopy by Dr. Larene, but patient is if it is uncertain   I do not see that report. But recall due to age  Patient Care Team: Melonie Colonel, Mikel HERO, MD as PCP - General (Family Medicine)  HISTORY OF PRESENT ILLNESS: 78 y.o. female with a past medical history listed below presents for evaluation of GERD, chronic AB pain.   Patient was last seen in the office 05/19/2021 by Dr. Charlanne for upper abdominal pain.  Discussed the use of AI scribe software for clinical note transcription with the patient, who gave verbal consent to proceed.  History of Present Illness   Catherine Stone is an 78 year old female with chronic abdominal and back pain who presents with persistent pain and gastrointestinal symptoms. She was referred by Dr. Charlanne for evaluation of her chronic pain and gastrointestinal symptoms.  She experiences chronic burning pain in her back and across her abdomen, persisting for years. The pain is described as a burning sensation, sometimes feeling  like 'somebody stabbed me in the heart.' It is located in the upper abdomen and radiates to her back, particularly under the shoulder blades. The pain occurs daily, with varying intensity, and can last for hours. Anxiety may exacerbate the pain. She has received shots in her back, which did not provide relief.  She  has a history of gastrointestinal issues, including a fundoplication surgery, and has been treated for gastritis in the past. She reports bloating and that her digestive symptoms have improved with Pepcid. She is currently taking Nexium 40 mg once daily and Pepcid at night. She has tried various medications including pantoprazole, Protonix, and Dexilant, with Dexilant providing the most relief in the past. No reflux, trouble swallowing, or acid in her throat, but she reports occasional nausea and constipation.  Her constipation is managed with Miralax, which she takes every other day, and it helps her have regular bowel movements. No lower abdominal cramping or pain, and no blood in her stool. She has a history of taking Lyrica  (pregabalin ) for years, and is currently on gabapentin 100 mg three times a day. She experiences lightheadedness, which she suspects may be related to her medication.  She has a history of surgeries, including an hysterectomy in 1995 and another surgery in February for her ovaries. She describes a sensation of 'pushing out' in the left upper abdomen when the pain is severe. No family history of gallbladder issues and no yellowing of the eyes or skin.      She  reports that she has never smoked. She has never used smokeless tobacco. She reports that she does not drink alcohol  and does not use drugs.  RELEVANT GI HISTORY, IMAGING AND LABS: Results   LABS Hb: 100 mg three times a day  RADIOLOGY CT scan: Normal gallbladder, liver, pancreas, spleen, and bowels; previous fundoplication noted (2024)  DIAGNOSTIC Endoscopy: Dilation performed (04/07/2020)      CBC    Component Value Date/Time   WBC 8.6 12/21/2023 0818   RBC 4.50 12/21/2023 0818   HGB 13.3 12/21/2023 0818   HCT 42.2 12/21/2023 0818   PLT 229 12/21/2023 0818   MCV 93.8 12/21/2023 0818   MCH 29.6 12/21/2023 0818   MCHC 31.5 12/21/2023 0818   RDW 14.2 12/21/2023 0818   Recent Labs    12/21/23 0818  HGB  13.3    CMP     Component Value Date/Time   NA 141 12/21/2023 0818   K 3.5 12/21/2023 0818   CL 104 12/21/2023 0818   CO2 26 12/21/2023 0818   GLUCOSE 86 12/21/2023 0818   BUN 22 12/21/2023 0818   CREATININE 0.49 12/21/2023 0818   CALCIUM 8.9 12/21/2023 0818   PROT 6.6 12/21/2023 0818   ALBUMIN 3.9 12/21/2023 0818   AST 18 12/21/2023 0818   ALT 14 12/21/2023 0818   ALKPHOS 80 12/21/2023 0818   BILITOT 0.4 12/21/2023 0818   GFRNONAA >60 12/21/2023 0818      Latest Ref Rng & Units 12/21/2023    8:18 AM 03/08/2021    2:42 PM  Hepatic Function  Total Protein 6.5 - 8.1 g/dL 6.6  6.9   Albumin 3.5 - 5.0 g/dL 3.9  4.2   AST 15 - 41 U/L 18  17   ALT 0 - 44 U/L 14  17   Alk Phosphatase 38 - 126 U/L 80  88   Total Bilirubin 0.0 - 1.2 mg/dL 0.4  0.3       Current Medications:  Current Outpatient Medications (Cardiovascular):    pravastatin (PRAVACHOL) 20 MG tablet, Take 20 mg by mouth every morning.  Current Outpatient Medications (Respiratory):    Azelastine HCl 137 MCG/SPRAY SOLN, Place 1 spray into the nose daily as needed (nasal congestion/allergies.).   fluticasone  (FLONASE ) 50 MCG/ACT nasal spray, Place 1 spray into both nostrils daily as needed (nasal congestion/allergies.).   promethazine (PHENERGAN) 12.5 MG tablet, Take 12.5 mg by mouth every 6 (six) hours as needed for nausea or vomiting.    Current Outpatient Medications (Other):    DULoxetine (CYMBALTA) 20 MG capsule, Take 20 mg by mouth 2 (two) times daily.   famotidine (PEPCID) 20 MG tablet, Take 20 mg by mouth at bedtime.   gabapentin (NEURONTIN) 100 MG capsule, Take 100 mg by mouth 3 (three) times daily.   Multiple Vitamins-Minerals (PRESERVISION AREDS) CAPS, Take 1 capsule by mouth in the morning. (Patient taking differently: Take 1 capsule by mouth 2 (two) times daily.)   polyethylene glycol powder (GLYCOLAX/MIRALAX) 17 GM/SCOOP powder, Take 17 g by mouth every other day.   pregabalin  (LYRICA ) 50 MG  capsule, Take 1 capsule (50 mg total) by mouth 3 (three) times daily. (Patient taking differently: Take 50 mg by mouth as needed.)   Simethicone (GAS-X PO), Take 1 tablet by mouth 3 (three) times daily.   temazepam (RESTORIL) 15 MG capsule, Take 15 mg by mouth at bedtime as needed for sleep.   tetrahydrozoline 0.05 % ophthalmic solution, Place 1-2 drops into both eyes in the morning.   zolpidem (AMBIEN) 10 MG tablet, Take 10 mg by mouth at bedtime.   esomeprazole (NEXIUM) 40 MG capsule, Take 1 capsule (40 mg total) by mouth 2 (two) times daily before a meal.  Medical History:  Past Medical History:  Diagnosis Date   Anxiety    Depression    Family history of colon cancer    GERD (gastroesophageal reflux disease)    Hepatitis    inactive hep B   History of colon polyps    History of hepatitis B    Hypercholesterolemia    PONV (postoperative nausea and vomiting)    Allergies:  Allergies  Allergen Reactions   Tape Dermatitis    Skin tears  Surgical drape adhesive caused blisters to skin area below breast on 01/15/24   Latex Itching and Swelling     Surgical History:  She  has a past surgical history that includes Nissen fundoplication (1995); Rotator cuff repair (Right, 2006); Knee arthroscopy; Laryngoplasty (Bilateral); Colonoscopy (09/23/2009); Esophagogastroduodenoscopy (04/07/2020); Tubal ligation; and Robotic assisted salpingo oophorectomy (Bilateral, 01/16/2024). Family History:  Her family history includes Colon cancer (age of onset: 67) in her mother.  REVIEW OF SYSTEMS  : All other systems reviewed and negative except where noted in the History of Present Illness.  PHYSICAL EXAM: BP 120/70 (BP Location: Left Arm, Patient Position: Sitting, Cuff Size: Normal)   Pulse 80   Ht 5' 3 (1.6 m) Comment: height  measured without shoes  Wt 109 lb 2 oz (49.5 kg)   BMI 19.33 kg/m  Physical Exam   GENERAL APPEARANCE: Well nourished, in no apparent distress. HEENT: No cervical  lymphadenopathy, unremarkable thyroid , sclerae anicteric, conjunctiva pink. RESPIRATORY: Respiratory effort normal, breath sounds equal bilaterally without rales, rhonchi, or wheezing. CARDIO: Regular rate and rhythm with no murmurs, rubs, or gallops, peripheral pulses intact. ABDOMEN: Soft, non-distended, active bowel sounds in all four quadrants, epigastric tenderness, pinpoint tenderness in left upper abdomen, well-healed vertical scar from breastbone to suprapubic area,  no rebound, no mass appreciated. RECTAL: Declines. MUSCULOSKELETAL: Full range of motion, normal gait, without edema. SKIN: Dry, intact without rashes or lesions. No jaundice. NEURO: Alert, oriented, no focal deficits. PSYCH: Cooperative, normal mood and affect.      Alan JONELLE Coombs, PA-C 9:11 AM

## 2024-05-21 ENCOUNTER — Ambulatory Visit: Admitting: Physician Assistant

## 2024-05-21 ENCOUNTER — Other Ambulatory Visit (INDEPENDENT_AMBULATORY_CARE_PROVIDER_SITE_OTHER)

## 2024-05-21 ENCOUNTER — Ambulatory Visit (INDEPENDENT_AMBULATORY_CARE_PROVIDER_SITE_OTHER)
Admission: RE | Admit: 2024-05-21 | Discharge: 2024-05-21 | Disposition: A | Discharge status: Home / Self Care | Source: Ambulatory Visit | Attending: Physician Assistant | Admitting: Physician Assistant

## 2024-05-21 ENCOUNTER — Encounter: Payer: Self-pay | Admitting: Physician Assistant

## 2024-05-21 ENCOUNTER — Ambulatory Visit: Payer: Self-pay | Admitting: Physician Assistant

## 2024-05-21 VITALS — BP 120/70 | HR 80 | Ht 63.0 in | Wt 109.1 lb

## 2024-05-21 DIAGNOSIS — Z860100 Personal history of colon polyps, unspecified: Secondary | ICD-10-CM

## 2024-05-21 DIAGNOSIS — K219 Gastro-esophageal reflux disease without esophagitis: Secondary | ICD-10-CM

## 2024-05-21 DIAGNOSIS — R1012 Left upper quadrant pain: Secondary | ICD-10-CM

## 2024-05-21 DIAGNOSIS — K581 Irritable bowel syndrome with constipation: Secondary | ICD-10-CM | POA: Diagnosis not present

## 2024-05-21 DIAGNOSIS — K5900 Constipation, unspecified: Secondary | ICD-10-CM | POA: Diagnosis not present

## 2024-05-21 DIAGNOSIS — K5904 Chronic idiopathic constipation: Secondary | ICD-10-CM

## 2024-05-21 DIAGNOSIS — R110 Nausea: Secondary | ICD-10-CM | POA: Diagnosis not present

## 2024-05-21 DIAGNOSIS — G8929 Other chronic pain: Secondary | ICD-10-CM | POA: Diagnosis not present

## 2024-05-21 DIAGNOSIS — Z860101 Personal history of adenomatous and serrated colon polyps: Secondary | ICD-10-CM

## 2024-05-21 DIAGNOSIS — I878 Other specified disorders of veins: Secondary | ICD-10-CM | POA: Diagnosis not present

## 2024-05-21 LAB — COMPREHENSIVE METABOLIC PANEL WITH GFR
ALT: 12 U/L (ref 0–35)
AST: 14 U/L (ref 0–37)
Albumin: 4.3 g/dL (ref 3.5–5.2)
Alkaline Phosphatase: 84 U/L (ref 39–117)
BUN: 21 mg/dL (ref 6–23)
CO2: 31 meq/L (ref 19–32)
Calcium: 9.4 mg/dL (ref 8.4–10.5)
Chloride: 103 meq/L (ref 96–112)
Creatinine, Ser: 0.79 mg/dL (ref 0.40–1.20)
GFR: 70.85 mL/min (ref >60.00)
Glucose, Bld: 103 mg/dL — ABNORMAL HIGH (ref 70–99)
Potassium: 3.9 meq/L (ref 3.5–5.1)
Sodium: 140 meq/L (ref 135–145)
Total Bilirubin: 0.4 mg/dL (ref 0.2–1.2)
Total Protein: 7.1 g/dL (ref 6.0–8.3)

## 2024-05-21 LAB — CBC WITH DIFFERENTIAL/PLATELET
Basophils Absolute: 0 10*3/uL (ref 0.0–0.1)
Basophils Relative: 0.4 % (ref 0.0–3.0)
Eosinophils Absolute: 0.1 10*3/uL (ref 0.0–0.7)
Eosinophils Relative: 1 % (ref 0.0–5.0)
HCT: 42.8 % (ref 36.0–46.0)
Hemoglobin: 14.2 g/dL (ref 12.0–15.0)
Lymphocytes Relative: 16.5 % (ref 12.0–46.0)
Lymphs Abs: 1.6 10*3/uL (ref 0.7–4.0)
MCHC: 33 g/dL (ref 30.0–36.0)
MCV: 88.4 fl (ref 78.0–100.0)
Monocytes Absolute: 0.6 10*3/uL (ref 0.1–1.0)
Monocytes Relative: 6.6 % (ref 3.0–12.0)
Neutro Abs: 7.5 10*3/uL (ref 1.4–7.7)
Neutrophils Relative %: 75.5 % (ref 43.0–77.0)
Platelets: 267 10*3/uL (ref 150.0–400.0)
RBC: 4.84 Mil/uL (ref 3.87–5.11)
RDW: 14.4 % (ref 11.5–15.5)
WBC: 9.9 10*3/uL (ref 4.0–10.5)

## 2024-05-21 LAB — SEDIMENTATION RATE: Sed Rate: 13 mm/h (ref 0–30)

## 2024-05-21 MED ORDER — ESOMEPRAZOLE MAGNESIUM 40 MG PO CPDR: 40.0000 mg | DELAYED_RELEASE_CAPSULE | Freq: Two times a day (BID) | ORAL | 0 refills | Status: AC

## 2024-05-21 NOTE — Patient Instructions (Signed)
 Your provider has requested that you go to the basement level for lab work before leaving today. Press B on the elevator. The lab is located at the first door on the left as you exit the elevator.  Your provider has requested that you have an abdominal x ray before leaving today. Please go to the basement floor to our Radiology department for the test.  You have been scheduled for a gastric emptying scan at [facility] on 06/12/24 at 8:00 am. Please arrive at least 30 minutes prior to your appointment for registration. Please make certain not to have anything to eat or drink after midnight the night before your test. Hold all stomach medications (ex: Zofran , phenergan, Reglan) 24 hours prior to your test. If you need to reschedule your appointment, please contact radiology scheduling at [phone]. _____________________________________________________________________ A gastric-emptying study measures how long it takes for food to move through your stomach. There are several ways to measure stomach emptying. In the most common test, you eat food that contains a small amount of radioactive material. A scanner that detects the movement of the radioactive material is placed over your abdomen to monitor the rate at which food leaves your stomach. This test normally takes about 4 hours to complete. _____________________________________________________________________  Catherine Stone have been scheduled for a HIDA scan at [facility] (1st floor) on 06/06/24. Please arrive 30 minutes prior to your scheduled appointment at  8:00 am. Make certain not to have anything to eat or drink at least 6 hours prior to your test. Should this appointment date or time not work well for you, please call radiology scheduling at [phone].  _____________________________________________________________________ hepatobiliary (HIDA) scan is an imaging procedure used to diagnose problems in the liver, gallbladder and  bile ducts. In the HIDA scan, a radioactive chemical or tracer is injected into a vein in your arm. The tracer is handled by the liver like bile. Bile is a fluid produced and excreted by your liver that helps your digestive system break down fats in the foods you eat. Bile is stored in your gallbladder and the gallbladder releases the bile when you eat a meal. A special nuclear medicine scanner (gamma camera) tracks the flow of the tracer from your liver into your gallbladder and small intestine.  During your HIDA scan  You'll be asked to change into a hospital gown before your HIDA scan begins. Your health care team will position you on a table, usually on your back. The radioactive tracer is then injected into a vein in your arm.The tracer travels through your bloodstream to your liver, where it's taken up by the bile-producing cells. The radioactive tracer travels with the bile from your liver into your gallbladder and through your bile ducts to your small intestine.You may feel some pressure while the radioactive tracer is injected into your vein. As you lie on the table, a special gamma camera is positioned over your abdomen taking pictures of the tracer as it moves through your body. The gamma camera takes pictures continually for about an hour. You'll need to keep still during the HIDA scan. This can become uncomfortable, but you may find that you can lessen the discomfort by taking deep breaths and thinking about other things. Tell your health care team if you're uncomfortable. The radiologist will watch on a computer the progress of the radioactive tracer through your body. The HIDA scan may be stopped when the radioactive tracer is seen in the gallbladder and enters your small intestine.  This typically takes about an hour. In some cases extra imaging will be performed if original images aren't satisfactory, if morphine  is given to help visualize the gallbladder or if the medication CCK is given to look  at the contraction of the gallbladder. This test typically takes 2 hours to complete. ________________________________________________________________________    Please take your proton pump inhibitor medication, nexium twice a day for a month Please take this medication 30 minutes to 1 hour before meals- this makes it more effective.  Continue the pepcid at night Avoid spicy and acidic foods Avoid fatty foods Limit your intake of coffee, tea, alcohol , and carbonated drinks Work to maintain a healthy weight Keep the head of the bed elevated at least 3 inches with blocks or a wedge pillow if you are having any nighttime symptoms Stay upright for 2 hours after eating Avoid meals and snacks three to four hours before bedtime  Reflux Gourmet Rescue  It is an ALGINATE THERAPY which is the only intervention that works to safeguard the esophagus by creating a protective barrier that actually stops reflux from happening. -The general directions for use are as stated on the packaging: Take 1 teaspoon (5 ml), or more as needed or as directed by your physician, after meals and before bed. -These general directions address the most common times for reflux to occur, but our Rescue products may be taken anytime. Some individuals may take our product preemptively, when they know they will suffer from reflux, or as needed - when discomfort arises. (If taken around food, it should be consumed last.) -You do not have to take 1 teaspoon (5 ml) of the product. While one teaspoon (5ml) may be the perfect average amount to relieve reflux suffering in some, others may require more or less. You may adjust the amount of Mint Chocolate Rescue and Vanilla Caramel Rescue to the lowest amount necessary to meet your individual needs to improve your quality of life. -You may dilute the product if it is too viscous for you to consume. Keep in mind, however, that the thickness of the product was formulated to provide optimal  coating and protection of your throat and esophagus. Though diluting the product is possible, it may reduce the protective function and/or length of action. -This can be used in conjunction with reflux medications and lifestyle changes.  100% ALL-NATURAL  Paraben FREE, glycerin FREE, & potassium FREE  Made entirely from all-natural ingredients considered safe for children and during pregnancy  No known side effects  All-natural flavor Gluten FREE  Allergen FREE  Vegan  Can find more information here: NameSeizer.co.nz   Try the dicyclomine to see if this helps  You have been scheduled for an endoscopy. Please follow written instructions given to you at your visit today.  If you use inhalers (even only as needed), please bring them with you on the day of your procedure.  If you take any of the following medications, they will need to be adjusted prior to your procedure:   DO NOT TAKE 7 DAYS PRIOR TO TEST- Trulicity (dulaglutide) Ozempic, Wegovy (semaglutide) Mounjaro (tirzepatide) Bydureon Bcise (exanatide extended release)  DO NOT TAKE 1 DAY PRIOR TO YOUR TEST Rybelsus (semaglutide) Adlyxin (lixisenatide) Victoza (liraglutide) Byetta (exanatide) ___________________________________________________________________________  _______________________________________________________  If your blood pressure at your visit was 140/90 or greater, please contact your primary care physician to follow up on this.  _______________________________________________________  If you are age 5 or older, your body mass index should be between 23-30. Your  Body mass index is 19.33 kg/m. If this is out of the aforementioned range listed, please consider follow up with your Primary Care Provider.  If you are age 38 or younger, your body mass index should be between 19-25. Your Body mass index is 19.33 kg/m. If this is out of the aformentioned range listed,  please consider follow up with your Primary Care Provider.   ________________________________________________________  The [place] [facility] providers would like to encourage you to use MYCHART to communicate with providers for non-urgent requests or questions.  Due to long hold times on the telephone, sending your provider a message by [facility] may be a faster and more efficient way to get a response.  Please allow 48 business hours for a response.  Please remember that this is for non-urgent requests.  _______________________________________________________  Due to recent changes in healthcare laws, you may see the results of your imaging and laboratory studies on MyChart before your provider has had a chance to review them.  We understand that in some cases there may be results that are confusing or concerning to you. Not all laboratory results come back in the same time frame and the provider may be waiting for multiple results in order to interpret others.  Please give us  48 hours in order for your provider to thoroughly review all the results before contacting the office for clarification of your results.   Thank you for entrusting me with your care and choosing [facility].  Alan Coombs PA-C

## 2024-05-27 ENCOUNTER — Other Ambulatory Visit: Payer: Self-pay

## 2024-05-27 MED ORDER — LINACLOTIDE 72 MCG PO CAPS: 72.0000 ug | ORAL_CAPSULE | Freq: Every day | ORAL | 1 refills | Status: AC

## 2024-05-27 NOTE — Progress Notes (Signed)
 Spoke to patient advise per provider xray shows moderate to large stool burden throughout the colon.This may be contributing to some of the discomfor you are having. Advise patient provider recommends she do a bowel purge instructions was sent to patient mychart. Pt states she will check her mychart.

## 2024-06-06 ENCOUNTER — Ambulatory Visit (HOSPITAL_COMMUNITY)
Admission: RE | Admit: 2024-06-06 | Discharge: 2024-06-06 | Disposition: A | Discharge status: Home / Self Care | Source: Ambulatory Visit | Attending: Physician Assistant | Admitting: Physician Assistant

## 2024-06-06 ENCOUNTER — Encounter (HOSPITAL_COMMUNITY): Payer: Self-pay

## 2024-06-06 DIAGNOSIS — K219 Gastro-esophageal reflux disease without esophagitis: Secondary | ICD-10-CM | POA: Diagnosis not present

## 2024-06-06 DIAGNOSIS — R110 Nausea: Secondary | ICD-10-CM | POA: Diagnosis not present

## 2024-06-06 DIAGNOSIS — R1011 Right upper quadrant pain: Secondary | ICD-10-CM | POA: Diagnosis not present

## 2024-06-06 MED ORDER — TECHNETIUM TC 99M MEBROFENIN IV KIT
5.4100 | PACK | Freq: Once | INTRAVENOUS | Status: AC
  Administered 2024-06-06: 5.41 via INTRAVENOUS

## 2024-06-12 ENCOUNTER — Encounter (HOSPITAL_COMMUNITY): Payer: Self-pay

## 2024-06-12 ENCOUNTER — Ambulatory Visit (HOSPITAL_COMMUNITY)
Admission: RE | Admit: 2024-06-12 | Discharge: 2024-06-12 | Disposition: A | Discharge status: Home / Self Care | Source: Ambulatory Visit | Attending: Physician Assistant | Admitting: Physician Assistant

## 2024-06-12 DIAGNOSIS — K219 Gastro-esophageal reflux disease without esophagitis: Secondary | ICD-10-CM | POA: Diagnosis not present

## 2024-06-12 DIAGNOSIS — R110 Nausea: Secondary | ICD-10-CM | POA: Diagnosis not present

## 2024-06-12 DIAGNOSIS — Z0389 Encounter for observation for other suspected diseases and conditions ruled out: Secondary | ICD-10-CM | POA: Diagnosis not present

## 2024-06-12 MED ORDER — TECHNETIUM TC 99M SULFUR COLLOID
2.0000 | Freq: Once | INTRAVENOUS | Status: AC | PRN
  Administered 2024-06-12: 2.15 via ORAL

## 2024-07-09 DIAGNOSIS — G588 Other specified mononeuropathies: Secondary | ICD-10-CM | POA: Diagnosis not present

## 2024-07-10 ENCOUNTER — Encounter: Payer: Self-pay | Admitting: Gastroenterology

## 2024-07-18 ENCOUNTER — Encounter: Payer: Self-pay | Admitting: Gastroenterology

## 2024-07-18 ENCOUNTER — Ambulatory Visit (AMBULATORY_SURGERY_CENTER): Admitting: Gastroenterology

## 2024-07-18 VITALS — BP 112/64 | HR 77 | Temp 98.0°F | Resp 15 | Wt 109.1 lb

## 2024-07-18 DIAGNOSIS — K2970 Gastritis, unspecified, without bleeding: Secondary | ICD-10-CM | POA: Diagnosis not present

## 2024-07-18 DIAGNOSIS — K319 Disease of stomach and duodenum, unspecified: Secondary | ICD-10-CM | POA: Diagnosis not present

## 2024-07-18 DIAGNOSIS — Z9889 Other specified postprocedural states: Secondary | ICD-10-CM

## 2024-07-18 DIAGNOSIS — F32A Depression, unspecified: Secondary | ICD-10-CM | POA: Diagnosis not present

## 2024-07-18 DIAGNOSIS — K219 Gastro-esophageal reflux disease without esophagitis: Secondary | ICD-10-CM

## 2024-07-18 DIAGNOSIS — F419 Anxiety disorder, unspecified: Secondary | ICD-10-CM | POA: Diagnosis not present

## 2024-07-18 MED ORDER — SODIUM CHLORIDE 0.9 % IV SOLN: 500.0000 mL | Freq: Once | INTRAVENOUS | Status: DC

## 2024-07-18 NOTE — Progress Notes (Signed)
1331 Robinul 0.1 mg IV given due large amount of secretions upon assessment.  MD made aware, vss  

## 2024-07-18 NOTE — Progress Notes (Signed)
 Report given to PACU, vss

## 2024-07-18 NOTE — Progress Notes (Signed)
 05/21/2024 Catherine Stone [id number] [birth year]   Referring provider: Melonie Colonel, Mikel HERO, * Primary GI doctor: Dr. Charlanne   ASSESSMENT AND PLAN:    Chronic left upper abdominal pain Chronic for years, daily burning epigastric discomfort with some sharp hot poker pain under left shoulder blade Just had injections in her back to help pain but this did not help Occ GERD, nausea occ, no dysphagia/regurg 09/23/2009 colonoscopy 1 cm polyp s/p polypectomy. Bx-tubular adenoma   Per patient she had recent colonoscopy by Dr. Larene.  I do not see that report.  06/27/2023 CT abdomen pelvis with contrast interval progression of complex multicystic lesion right posterior pelvis 7 x 3.8 x 4.4 cm, no ascites Status post bilateral oophorectomy 01/16/2024 with Dr. Eldonna Patient continues to have pain, tearful with the discomfort, still sounds like MSK/adhesions/radicular however will evaluate for GI causea -repeat EGD at [facility] with Dr. Charlanne with symptoms of GERD continuing -will get GES since she has early satiety  -will get HIDA with pain radiating to shoulder blade.  -Increase nexium  to 40 mg BID for a month, continue pepcid at night -Add on allginate therapy - trial of dicycolmine for pain -Continue follow up with ortho for possible radicular pain -If these test are unremarkable, will set up for AB injection in the office with Dr. San and may need pain management referral - consider testing for SIBO or trial of flagyl 250 mg TID for 10 days   GERD with remote history of nissan fundiplication 04/07/2020 EGD status post dilation at [facility] UGI series 07/2021 Intact Nissen fundoplication with no convincing evidence of a recurrent hiatal hernia. No gastroesophageal reflux elicited. Unremarkable upper GI with no evidence of masses, strictures or peptic ulcer disease. Have tried Protonix without help and Dexilant did help but was stopped Currently on nexium  40 mg at night and pepcid 20  mg at night, this has helped some - increase nexium  to BID for a month - alginate therapy - will plan on EGD at [facility], I discussed risks of EGD with patient today, including risk of sedation, bleeding or perforation.  Patient provides understanding and gave verbal consent to proceed. -Get GES as this can be common after nissan fundiplication   IBS-C She is on miralax 1 capful every other day, occ daily No hematochezia, bowels controlled on miralax - get KUB - consider linzess  trial 72 mcg   Personal history of colon polyps 09/23/2009 colonoscopy 1 cm polyp s/p polypectomy. Bx-tubular adenoma   Per patient she had recent colonoscopy by Dr. Larene, but patient is if it is uncertain   I do not see that report. But recall due to age   Patient Care Team: Melonie Colonel, Mikel HERO, MD as PCP - General (Family Medicine)   HISTORY OF PRESENT ILLNESS: 78 y.o. female with a past medical history listed below presents for evaluation of GERD, chronic AB pain.    Patient was last seen in the office 05/19/2021 by Dr. Charlanne for upper abdominal pain.   Discussed the use of AI scribe software for clinical note transcription with the patient, who gave verbal consent to proceed.   History of Present Illness   Catherine Stone is an 78 year old female with chronic abdominal and back pain who presents with persistent pain and gastrointestinal symptoms. She was referred by Dr. Charlanne for evaluation of her chronic pain and gastrointestinal symptoms.   She experiences chronic burning pain in her back and across her abdomen, persisting for  years. The pain is described as a burning sensation, sometimes feeling like 'somebody stabbed me in the heart.' It is located in the upper abdomen and radiates to her back, particularly under the shoulder blades. The pain occurs daily, with varying intensity, and can last for hours. Anxiety may exacerbate the pain. She has received shots in her back, which did not provide  relief.   She has a history of gastrointestinal issues, including a fundoplication surgery, and has been treated for gastritis in the past. She reports bloating and that her digestive symptoms have improved with Pepcid. She is currently taking Nexium  40 mg once daily and Pepcid at night. She has tried various medications including pantoprazole, Protonix, and Dexilant, with Dexilant providing the most relief in the past. No reflux, trouble swallowing, or acid in her throat, but she reports occasional nausea and constipation.   Her constipation is managed with Miralax, which she takes every other day, and it helps her have regular bowel movements. No lower abdominal cramping or pain, and no blood in her stool. She has a history of taking Lyrica  (pregabalin ) for years, and is currently on gabapentin 100 mg three times a day. She experiences lightheadedness, which she suspects may be related to her medication.   She has a history of surgeries, including an hysterectomy in 1995 and another surgery in February for her ovaries. She describes a sensation of 'pushing out' in the left upper abdomen when the pain is severe. No family history of gallbladder issues and no yellowing of the eyes or skin.       She  reports that she has never smoked. She has never used smokeless tobacco. She reports that she does not drink alcohol  and does not use drugs.   RELEVANT GI HISTORY, IMAGING AND LABS: Results   LABS Hb: 100 mg three times a day   RADIOLOGY CT scan: Normal gallbladder, liver, pancreas, spleen, and bowels; previous fundoplication noted (2024)   DIAGNOSTIC Endoscopy: Dilation performed (04/07/2020)       CBC Labs (Brief)          Component Value Date/Time    WBC 8.6 12/21/2023 0818    RBC 4.50 12/21/2023 0818    HGB 13.3 12/21/2023 0818    HCT 42.2 12/21/2023 0818    PLT 229 12/21/2023 0818    MCV 93.8 12/21/2023 0818    MCH 29.6 12/21/2023 0818    MCHC 31.5 12/21/2023 0818    RDW 14.2  12/21/2023 0818      Recent Labs (within last 365 days)     Recent Labs    12/21/23 0818  HGB 13.3        CMP     Labs (Brief)          Component Value Date/Time    NA 141 12/21/2023 0818    K 3.5 12/21/2023 0818    CL 104 12/21/2023 0818    CO2 26 12/21/2023 0818    GLUCOSE 86 12/21/2023 0818    BUN 22 12/21/2023 0818    CREATININE 0.49 12/21/2023 0818    CALCIUM 8.9 12/21/2023 0818    PROT 6.6 12/21/2023 0818    ALBUMIN 3.9 12/21/2023 0818    AST 18 12/21/2023 0818    ALT 14 12/21/2023 0818    ALKPHOS 80 12/21/2023 0818    BILITOT 0.4 12/21/2023 0818    GFRNONAA >60 12/21/2023 0818          Latest Ref Rng & Units 12/21/2023  8:18 AM 03/08/2021    2:42 PM  Hepatic Function  Total Protein 6.5 - 8.1 g/dL 6.6  6.9   Albumin 3.5 - 5.0 g/dL 3.9  4.2   AST 15 - 41 U/L 18  17   ALT 0 - 44 U/L 14  17   Alk Phosphatase 38 - 126 U/L 80  88   Total Bilirubin 0.0 - 1.2 mg/dL 0.4  0.3       Current Medications:      Current Outpatient Medications (Cardiovascular):    pravastatin (PRAVACHOL) 20 MG tablet, Take 20 mg by mouth every morning.   Current Outpatient Medications (Respiratory):    Azelastine HCl 137 MCG/SPRAY SOLN, Place 1 spray into the nose daily as needed (nasal congestion/allergies.).   fluticasone  (FLONASE ) 50 MCG/ACT nasal spray, Place 1 spray into both nostrils daily as needed (nasal congestion/allergies.).   promethazine (PHENERGAN) 12.5 MG tablet, Take 12.5 mg by mouth every 6 (six) hours as needed for nausea or vomiting.       Current Outpatient Medications (Other):    DULoxetine (CYMBALTA) 20 MG capsule, Take 20 mg by mouth 2 (two) times daily.   famotidine (PEPCID) 20 MG tablet, Take 20 mg by mouth at bedtime.   gabapentin (NEURONTIN) 100 MG capsule, Take 100 mg by mouth 3 (three) times daily.   Multiple Vitamins-Minerals (PRESERVISION AREDS) CAPS, Take 1 capsule by mouth in the morning. (Patient taking differently: Take 1 capsule by mouth 2  (two) times daily.)   polyethylene glycol powder (GLYCOLAX/MIRALAX) 17 GM/SCOOP powder, Take 17 g by mouth every other day.   pregabalin  (LYRICA ) 50 MG capsule, Take 1 capsule (50 mg total) by mouth 3 (three) times daily. (Patient taking differently: Take 50 mg by mouth as needed.)   Simethicone (GAS-X PO), Take 1 tablet by mouth 3 (three) times daily.   temazepam (RESTORIL) 15 MG capsule, Take 15 mg by mouth at bedtime as needed for sleep.   tetrahydrozoline 0.05 % ophthalmic solution, Place 1-2 drops into both eyes in the morning.   zolpidem (AMBIEN) 10 MG tablet, Take 10 mg by mouth at bedtime.   esomeprazole  (NEXIUM ) 40 MG capsule, Take 1 capsule (40 mg total) by mouth 2 (two) times daily before a meal.   Medical History:      Past Medical History:  Diagnosis Date   Anxiety     Depression     Family history of colon cancer     GERD (gastroesophageal reflux disease)     Hepatitis      inactive hep B   History of colon polyps     History of hepatitis B     Hypercholesterolemia     PONV (postoperative nausea and vomiting)          Allergies:  Allergies       Allergies  Allergen Reactions   Tape Dermatitis      Skin tears   Surgical drape adhesive caused blisters to skin area below breast on 01/15/24   Latex Itching and Swelling        Surgical History:  She  has a past surgical history that includes Nissen fundoplication (1995); Rotator cuff repair (Right, 2006); Knee arthroscopy; Laryngoplasty (Bilateral); Colonoscopy (09/23/2009); Esophagogastroduodenoscopy (04/07/2020); Tubal ligation; and Robotic assisted salpingo oophorectomy (Bilateral, 01/16/2024). Family History:  Her family history includes Colon cancer (age of onset: 61) in her mother.   REVIEW OF SYSTEMS  : All other systems reviewed and negative except where noted in the History of  Present Illness.   PHYSICAL EXAM: BP 120/70 (BP Location: Left Arm, Patient Position: Sitting, Cuff Size: Normal)   Pulse 80    Ht 5' 3 (1.6 m) Comment: height  measured without shoes  Wt 109 lb 2 oz (49.5 kg)   BMI 19.33 kg/m  Physical Exam   GENERAL APPEARANCE: Well nourished, in no apparent distress. HEENT: No cervical lymphadenopathy, unremarkable thyroid , sclerae anicteric, conjunctiva pink. RESPIRATORY: Respiratory effort normal, breath sounds equal bilaterally without rales, rhonchi, or wheezing. CARDIO: Regular rate and rhythm with no murmurs, rubs, or gallops, peripheral pulses intact. ABDOMEN: Soft, non-distended, active bowel sounds in all four quadrants, epigastric tenderness, pinpoint tenderness in left upper abdomen, well-healed vertical scar from breastbone to suprapubic area, no rebound, no mass appreciated. RECTAL: Declines. MUSCULOSKELETAL: Full range of motion, normal gait, without edema. SKIN: Dry, intact without rashes or lesions. No jaundice. NEURO: Alert, oriented, no focal deficits. PSYCH: Cooperative, normal mood and affect.       Alan JONELLE Coombs, PA-C     Attending physician's note   I have taken history, reviewed the chart and examined the patient. I performed a substantive portion of this encounter, including complete performance of at least one of the key components, in conjunction with the APP. I agree with the Advanced Practitioner's note, impression and recommendations.   For EGD Neg GES 05/2024   Anselm Bring, MD [facility] [phone]

## 2024-07-18 NOTE — Patient Instructions (Signed)
 Resume previous diet. Continue present medications.  Use Biofreeze spray 3 times daily. Let us  know how she is in 2 weeks. If still with problems, consider repeat CT abd/pelvis Awaiting pathology results. Continue follow up for ovarian cyst with GYN  YOU HAD AN ENDOSCOPIC PROCEDURE TODAY AT THE [facility]:   Refer to the procedure report that was given to you for any specific questions about what was found during the examination.  If the procedure report does not answer your questions, please call your gastroenterologist to clarify.  If you requested that your care partner not be given the details of your procedure findings, then the procedure report has been included in a sealed envelope for you to review at your convenience later.  YOU SHOULD EXPECT: Some feelings of bloating in the abdomen. Passage of more gas than usual.  Walking can help get rid of the air that was put into your GI tract during the procedure and reduce the bloating. If you had a lower endoscopy (such as a colonoscopy or flexible sigmoidoscopy) you may notice spotting of blood in your stool or on the toilet paper. If you underwent a bowel prep for your procedure, you may not have a normal bowel movement for a few days.  Please Note:  You might notice some irritation and congestion in your nose or some drainage.  This is from the oxygen used during your procedure.  There is no need for concern and it should clear up in a day or so.  SYMPTOMS TO REPORT IMMEDIATELY:  Following upper endoscopy (EGD)  Vomiting of blood or coffee ground material  New chest pain or pain under the shoulder blades  Painful or persistently difficult swallowing  New shortness of breath  Fever of 100F or higher  Black, tarry-looking stools  For urgent or emergent issues, a gastroenterologist can be reached at any hour by calling [phone]. Do not use MyChart messaging for urgent concerns.    DIET:  We do recommend a small meal  at first, but then you may proceed to your regular diet.  Drink plenty of fluids but you should avoid alcoholic beverages for 24 hours.  ACTIVITY:  You should plan to take it easy for the rest of today and you should NOT DRIVE or use heavy machinery until tomorrow (because of the sedation medicines used during the test).    FOLLOW UP: Our staff will call the number listed on your records the next business day following your procedure.  We will call around 7:15- 8:00 am to check on you and address any questions or concerns that you may have regarding the information given to you following your procedure. If we do not reach you, we will leave a message.     If any biopsies were taken you will be contacted by phone or by letter within the next 1-3 weeks.  Please call us  at [phone] if you have not heard about the biopsies in 3 weeks.    SIGNATURES/CONFIDENTIALITY: You and/or your care partner have signed paperwork which will be entered into your electronic medical record.  These signatures attest to the fact that that the information above on your After Visit Summary has been reviewed and is understood.  Full responsibility of the confidentiality of this discharge information lies with you and/or your care-partner.

## 2024-07-18 NOTE — Progress Notes (Signed)
 Called to room to assist during endoscopic procedure.  Patient ID and intended procedure confirmed with present staff. Received instructions for my participation in the procedure from the performing physician.

## 2024-07-18 NOTE — Op Note (Signed)
 [facility] Patient Name: Catherine Stone Procedure Date: 07/18/2024 1:30 PM MRN: [id number] Endoscopist: Lynnie Bring , MD, [id number] Age: 78 Referring MD:  Date of Birth: 29-Apr-1946 Gender: Female Account #: [id number] Procedure:                Upper GI endoscopy Indications:              Abdominal pain in the left upper quadrant Medicines:                Monitored Anesthesia Care Procedure:                Pre-Anesthesia Assessment:                           - Prior to the procedure, a History and Physical                            was performed, and patient medications and                            allergies were reviewed. The patient's tolerance of                            previous anesthesia was also reviewed. The risks                            and benefits of the procedure and the sedation                            options and risks were discussed with the patient.                            All questions were answered, and informed consent                            was obtained. Prior Anticoagulants: The patient has                            taken no anticoagulant or antiplatelet agents. ASA                            Grade Assessment: II - A patient with mild systemic                            disease. After reviewing the risks and benefits,                            the patient was deemed in satisfactory condition to                            undergo the procedure.                           After obtaining informed consent, the endoscope was  passed under direct vision. Throughout the                            procedure, the patient's blood pressure, pulse, and                            oxygen saturations were monitored continuously. The                            GIF [id number] #[id number] was introduced through the                            mouth, and advanced to the second part of duodenum.                            The upper GI  endoscopy was accomplished without                            difficulty. The patient tolerated the procedure                            well. Scope In: Scope Out: Findings:                 The examined esophagus was normal.                           Localized mild inflammation characterized by                            erythema was found in the gastric antrum. Biopsies                            were taken with a cold forceps for histology.                           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 The wrap appeared intact. This                            was traversed.                           The examined duodenum was normal. Biopsies for                            histology were taken with a cold forceps for                            evaluation of celiac disease. Complications:            No immediate complications. Estimated Blood Loss:     Estimated blood loss: none. Impression:               -  Mild Gastritis. Biopsied.                           - A Nissen fundoplication was found. The wrap                            appears intact. Recommendation:           - Patient has a contact number available for                            emergencies. The signs and symptoms of potential                            delayed complications were discussed with the                            patient. Return to normal activities tomorrow.                            Written discharge instructions were provided to the                            patient.                           - Resume previous diet.                           - Continue present medications.                           - Given extensive prev eval, likely etiology of                            left upper quadrant abdominal pain is                            musculoskeletal. Use Biofreeze spray 3 times daily.                            Let us  know how she is in 2 weeks.  If still with                            problems, would consider repeat CT Abdo/pelvis.                           - Await pathology results.                           - Continue follow-up for ovarian cyst with GYN.                           - The findings and recommendations were discussed  with the patient's family. upper quadrant abdominal                            pain elevated likely is musculoskeletal Lynnie Bring, MD 07/18/2024 1:50:56 PM This report has been signed electronically.

## 2024-07-19 ENCOUNTER — Telehealth: Payer: Self-pay

## 2024-07-19 NOTE — Telephone Encounter (Signed)
 Left message on answering machine.

## 2024-07-23 LAB — SURGICAL PATHOLOGY

## 2024-07-24 DIAGNOSIS — Z681 Body mass index (BMI) 19 or less, adult: Secondary | ICD-10-CM | POA: Diagnosis not present

## 2024-07-24 DIAGNOSIS — F5101 Primary insomnia: Secondary | ICD-10-CM | POA: Diagnosis not present

## 2024-07-24 DIAGNOSIS — F4323 Adjustment disorder with mixed anxiety and depressed mood: Secondary | ICD-10-CM | POA: Diagnosis not present

## 2024-07-24 DIAGNOSIS — G588 Other specified mononeuropathies: Secondary | ICD-10-CM | POA: Diagnosis not present

## 2024-08-11 ENCOUNTER — Ambulatory Visit: Payer: Self-pay | Admitting: Gastroenterology

## 2024-09-30 ENCOUNTER — Encounter: Payer: Self-pay | Admitting: Radiology

## 2024-10-02 DIAGNOSIS — M546 Pain in thoracic spine: Secondary | ICD-10-CM | POA: Diagnosis not present

## 2024-10-02 DIAGNOSIS — M4724 Other spondylosis with radiculopathy, thoracic region: Secondary | ICD-10-CM | POA: Diagnosis not present

## 2024-10-02 DIAGNOSIS — G8929 Other chronic pain: Secondary | ICD-10-CM | POA: Diagnosis not present

## 2024-10-15 DIAGNOSIS — M5414 Radiculopathy, thoracic region: Secondary | ICD-10-CM | POA: Diagnosis not present

## 2024-10-15 DIAGNOSIS — G8929 Other chronic pain: Secondary | ICD-10-CM | POA: Diagnosis not present

## 2024-10-15 DIAGNOSIS — M546 Pain in thoracic spine: Secondary | ICD-10-CM | POA: Diagnosis not present

## 2024-10-22 DIAGNOSIS — E785 Hyperlipidemia, unspecified: Secondary | ICD-10-CM | POA: Diagnosis not present

## 2024-10-22 DIAGNOSIS — Z23 Encounter for immunization: Secondary | ICD-10-CM | POA: Diagnosis not present

## 2024-10-22 DIAGNOSIS — G588 Other specified mononeuropathies: Secondary | ICD-10-CM | POA: Diagnosis not present

## 2024-10-22 DIAGNOSIS — Z681 Body mass index (BMI) 19 or less, adult: Secondary | ICD-10-CM | POA: Diagnosis not present

## 2024-10-22 DIAGNOSIS — F4323 Adjustment disorder with mixed anxiety and depressed mood: Secondary | ICD-10-CM | POA: Diagnosis not present

## 2024-10-22 DIAGNOSIS — F5101 Primary insomnia: Secondary | ICD-10-CM | POA: Diagnosis not present
